# Patient Record
Sex: Female | Born: 1995 | Race: Black or African American | Hispanic: No | Marital: Married | Smoking: Never smoker
Health system: Southern US, Community
[De-identification: ages and names within clinical notes are randomized; demographics above are authoritative.]

## PROBLEM LIST (undated history)

## (undated) ENCOUNTER — Inpatient Hospital Stay (HOSPITAL_COMMUNITY): Payer: Medicaid Other

## (undated) DIAGNOSIS — Z789 Other specified health status: Secondary | ICD-10-CM

## (undated) HISTORY — DX: Other specified health status: Z78.9

## (undated) HISTORY — PX: NO PAST SURGERIES: SHX2092

---

## 2021-04-23 ENCOUNTER — Ambulatory Visit (INDEPENDENT_AMBULATORY_CARE_PROVIDER_SITE_OTHER): Payer: Self-pay

## 2021-04-23 ENCOUNTER — Other Ambulatory Visit: Payer: Self-pay

## 2021-04-23 VITALS — BP 102/81 | HR 101 | Wt 171.0 lb

## 2021-04-23 DIAGNOSIS — Z3201 Encounter for pregnancy test, result positive: Secondary | ICD-10-CM

## 2021-04-23 LAB — POCT PREGNANCY, URINE: Preg Test, Ur: POSITIVE — AB

## 2021-04-23 MED ORDER — PROMETHAZINE HCL 25 MG PO TABS: 25.0000 mg | ORAL_TABLET | Freq: Four times a day (QID) | ORAL | 1 refills | Status: DC | PRN

## 2021-04-23 NOTE — Progress Notes (Signed)
Pt dropped off urine today for UPT. UPT is positive. Pt states having nausea. Pt advised Rx Phenergan to try. Pt agreeable. Rx sent to pharmacy on file per protocol. . Pt advised to eat small meals with snacks. Pt also advised to keep hydrated with 6-8 bottles of water daily. Pt verbalized understanding.    Pt denies any vaginal bleeding, abd pain or cramps at this time. Pt advised to start taking PNV and to call OB provider for next OB appt. Pt verbalized understanding and agreeable to plan of care. Pt given number to go to [facility]. Pt verbalized understanding.   Pt scheduled for anatomy US on 11/21 @ 1:15pm.   LMP: 11/29/2020 EDC: 09/05/2021 [redacted]w[redacted]d FHR 164  Judeth Cornfield, RN

## 2021-04-23 NOTE — Patient Instructions (Addendum)
Prenatal Care Providers   [facility]  [address] [phone]  Safe Medications in Pregnancy   Acne:  Benzoyl Peroxide  Salicylic Acid   Backache/Headache:  Tylenol: 2 regular strength every 4 hours OR               2 Extra strength every 6 hours   Colds/Coughs/Allergies:  Benadryl (alcohol free) 25 mg every 6 hours as needed  Breath right strips  Claritin  Cepacol throat lozenges  Chloraseptic throat spray  Cold-Eeze- up to three times per day  Cough drops, alcohol free  Flonase (by prescription only)  Guaifenesin  Mucinex  Robitussin DM (plain only, alcohol free)  Saline nasal spray/drops  Sudafed (pseudoephedrine) & Actifed * use only after [redacted] weeks gestation and if you do not have high blood pressure  Tylenol  Vicks Vaporub  Zinc lozenges  Zyrtec   Constipation:  Colace  Ducolax suppositories  Fleet enema  Glycerin suppositories  Metamucil  Milk of magnesia  Miralax  Senokot  Smooth move tea   Diarrhea:  Kaopectate  Imodium A-D   *NO pepto Bismol   Hemorrhoids:  Anusol  Anusol HC  Preparation H  Tucks   Indigestion:  Tums  Maalox  Mylanta  Zantac  Pepcid   Insomnia:  Benadryl (alcohol free) 25mg  every 6 hours as needed  Tylenol PM  Unisom, no Gelcaps   Leg Cramps:  Tums  MagGel   Nausea/Vomiting:  Bonine  Dramamine  Emetrol  Ginger extract  Sea bands  Meclizine  Nausea medication to take during pregnancy:  Unisom (doxylamine succinate 25 mg tablets) Take one tablet daily at bedtime. If symptoms are not adequately controlled, the dose can be increased to a maximum recommended dose of two tablets daily (1/2 tablet in the morning, 1/2 tablet mid-afternoon and one at bedtime).  Vitamin B6 100mg  tablets. Take one tablet twice a day (up to 200 mg per day).   Skin Rashes:  Aveeno products  Benadryl cream or 25mg  every 6 hours as needed  Calamine Lotion  1% cortisone cream   Yeast  infection:  Gyne-lotrimin 7  Monistat 7    **If taking multiple medications, please check labels to avoid duplicating the same active ingredients  **take medication as directed on the label  ** Do not exceed 4000 mg of tylenol in 24 hours  **Do not take medications that contain aspirin or ibuprofen

## 2021-04-30 NOTE — Progress Notes (Signed)
Patient was assessed and managed by nursing staff during this encounter. I have reviewed the chart and agree with the documentation and plan. I have also made any necessary editorial changes.  [place] Bing, MD 04/30/2021 1:39 PM

## 2021-05-11 ENCOUNTER — Telehealth: Payer: Self-pay | Admitting: Obstetrics and Gynecology

## 2021-05-11 ENCOUNTER — Other Ambulatory Visit: Payer: Self-pay

## 2021-05-11 ENCOUNTER — Ambulatory Visit: Payer: Medicaid Other | Attending: Obstetrics and Gynecology

## 2021-05-11 DIAGNOSIS — Z3201 Encounter for pregnancy test, result positive: Secondary | ICD-10-CM | POA: Diagnosis present

## 2021-05-11 DIAGNOSIS — Z3A23 23 weeks gestation of pregnancy: Secondary | ICD-10-CM

## 2021-05-11 DIAGNOSIS — Z3689 Encounter for other specified antenatal screening: Secondary | ICD-10-CM | POA: Diagnosis not present

## 2021-05-11 NOTE — Telephone Encounter (Signed)
Attempted to reach patient about scheduling an appointment. Was not able to speak with patient. Left a voicemail message for her to call the office to schedule her New OB appointment.

## 2021-05-13 ENCOUNTER — Ambulatory Visit (INDEPENDENT_AMBULATORY_CARE_PROVIDER_SITE_OTHER): Payer: Medicaid Other

## 2021-05-13 ENCOUNTER — Other Ambulatory Visit: Payer: Self-pay

## 2021-05-13 VITALS — BP 104/75 | HR 109 | Temp 98.0°F | Wt 171.4 lb

## 2021-05-13 DIAGNOSIS — O0990 Supervision of high risk pregnancy, unspecified, unspecified trimester: Secondary | ICD-10-CM | POA: Insufficient documentation

## 2021-05-13 DIAGNOSIS — O0930 Supervision of pregnancy with insufficient antenatal care, unspecified trimester: Secondary | ICD-10-CM | POA: Insufficient documentation

## 2021-05-13 DIAGNOSIS — Z3A23 23 weeks gestation of pregnancy: Secondary | ICD-10-CM

## 2021-05-13 DIAGNOSIS — Z3480 Encounter for supervision of other normal pregnancy, unspecified trimester: Secondary | ICD-10-CM

## 2021-05-13 DIAGNOSIS — Z3400 Encounter for supervision of normal first pregnancy, unspecified trimester: Secondary | ICD-10-CM | POA: Insufficient documentation

## 2021-05-13 NOTE — Progress Notes (Signed)
    I discussed the limitations, risks, security and privacy concerns of performing an evaluation and management service by telephone and the availability of in person appointments. I also discussed with the patient that there may be a patient responsible charge related to this service. The patient expressed understanding and agreed to proceed.   Location: [facility] Patient: clinic Provider: clinic Interpreter used: a telephonic interpreter Salome [phone]  PRENATAL INTAKE SUMMARY  Ms. Bieker presents today New OB Nurse Interview.  OB History     Gravida  1   Para  0   Term  0   Preterm  0   AB  0   Living  0      SAB  0   IAB  0   Ectopic  0   Multiple  0   Live Births  0          I have reviewed the patient's medical, obstetrical, social, and family histories, medications, and available lab results.  SUBJECTIVE She has no unusual complaints  OBJECTIVE Initial Nurse interview for history/labs (New OB)  last menstrual period date: 11/29/2020 EDD: 09/05/2021  GA: [redacted]w[redacted]d G1P0000 FHT: 154  GENERAL APPEARANCE: alert, well appearing, in no apparent distress, oriented to person, place and time   ASSESSMENT Normal pregnancy late to prenatal care   PLAN Prenatal care:  [facility] Pnl/HIV/Hep C OB Urine Culture GC/CT- will obtain at next visit  PAPpatient has never had a pap test will obtain at next visit  HgbEval/SMA/CF ([facility]) Panorama A1C   Anatomy ultrasound 05/12/2021  Placed OB Box on problem list and updated   Followup with Raelyn Mora CNM 05/28/2021 at 2:35   Follow Up Instructions:   I discussed the assessment and treatment plan with the patient. The patient was provided an opportunity to ask questions and all were answered. The patient agreed with the plan and demonstrated an understanding of the instructions.   The patient was advised to call back or seek an in-person evaluation if the symptoms worsen or if the  condition fails to improve as anticipated.  I provided 40 minutes of  face-to-face time during this encounter.  Gearldine Shown, CMA

## 2021-05-13 NOTE — Patient Instructions (Signed)
AREA PEDIATRIC/FAMILY PRACTICE PHYSICIANS  [facility] [address] [place], [place] [address] Phone - [phone]   Fax - [phone]  [facility] [address] [place], [place]  [address] Phone - [phone]   Fax - [phone]  [facility] [address] [place], [place]  [address] Phone - [phone]   Fax - [phone]  [facility] [address] [place], [place]  [address] Phone - [phone]   Fax - [phone]  [facility] [address] [place], [place]  [address] Phone - [phone]   Fax - [phone]  [facility] [address] [place], [place]  [address] Phone - [phone]   Fax - [phone]  [facility] [address] [place], [place]  [address] Phone - [phone]   Fax - [phone]  [facility] AT [facility] [address] [place], [address] [place], [place]  [address] Phone - [phone]   Fax - [phone]  [facility] AT [facility] [address] [place], [place]  [address] Phone - [phone]   Fax - [phone] [facility] [place] [address] [place], [place]  [address] Phone - [phone]   Fax - [phone]  [facility] [address] [place], [place]  [address] Phone - [phone]   Fax - [phone]  [facility] [address] [place], [place]  [address] Phone - [phone]   Fax - [phone]  [facility] [address] [place], [place]  [address] Phone - [phone]   Fax - [phone]  [facility] [address] [place], [place]  [address] Phone - [phone]  [facility] [address] [place], [place]  [address] Phone - [phone]   Fax - [phone]  [facility] [address] [place], [place]  [address] Phone - [phone]   Fax - [phone]  [facility] [address] [place], [place]  [address] Phone - [phone]   Fax - [phone]  [facility] [address] [place], [place]  [address] Phone - [phone]   Fax - [phone]   [facility] [address] [place], [place]  [address] Phone - [phone]   Fax - [phone]  [facility] [address] [place], [place]  [address] Phone - [phone]   Fax - [phone]  DA[facility] RUBIN [address] [place], [place]  [address] Phone - [phone]   Fax - [phone]  [facility] [address] [place], [place]  [address] Phone - [phone]   Fax - [phone]  [facility] [address] [place], [place]  [address] Phone - [phone]   Fax - [phone] Gerarda Fraction [address] [place], [place]  [address] Phone - [phone]   Fax - [phone]  [facility] [address] [place], [place]  [address] Phone - [phone]   Fax - [phone]  [facility] - [place] [address] [place], [place]  [address] Phone - [phone]   Fax - [phone]   Please remember that if you are not able to keep your appointment to call the office and reschedule, or cancel. If you no-show or cancel with less than 24 hour notice we  will charge you, not your insurance a $50 fee.

## 2021-05-14 ENCOUNTER — Encounter: Payer: Self-pay | Admitting: Obstetrics and Gynecology

## 2021-05-14 DIAGNOSIS — O99019 Anemia complicating pregnancy, unspecified trimester: Secondary | ICD-10-CM | POA: Insufficient documentation

## 2021-05-14 LAB — OBSTETRIC PANEL, INCLUDING HIV
Antibody Screen: NEGATIVE
Basophils Absolute: 0 10*3/uL (ref 0.0–0.2)
Basos: 0 %
EOS (ABSOLUTE): 0.1 10*3/uL (ref 0.0–0.4)
Eos: 2 %
HIV Screen 4th Generation wRfx: NONREACTIVE
Hematocrit: 29.3 % — ABNORMAL LOW (ref 34.0–46.6)
Hemoglobin: 8.8 g/dL — ABNORMAL LOW (ref 11.1–15.9)
Hepatitis B Surface Ag: NEGATIVE
Immature Grans (Abs): 0.1 10*3/uL (ref 0.0–0.1)
Immature Granulocytes: 1 %
Lymphocytes Absolute: 2 10*3/uL (ref 0.7–3.1)
Lymphs: 34 %
MCH: 21.1 pg — ABNORMAL LOW (ref 26.6–33.0)
MCHC: 30 g/dL — ABNORMAL LOW (ref 31.5–35.7)
MCV: 70 fL — ABNORMAL LOW (ref 79–97)
Monocytes Absolute: 0.4 10*3/uL (ref 0.1–0.9)
Monocytes: 6 %
Neutrophils Absolute: 3.3 10*3/uL (ref 1.4–7.0)
Neutrophils: 57 %
Platelets: 296 10*3/uL (ref 150–450)
RBC: 4.18 x10E6/uL (ref 3.77–5.28)
RDW: 18.4 % — ABNORMAL HIGH (ref 11.7–15.4)
RPR Ser Ql: NONREACTIVE
Rh Factor: POSITIVE
Rubella Antibodies, IGG: 6.05 index (ref >0.99)
WBC: 5.9 10*3/uL (ref 3.4–10.8)

## 2021-05-14 LAB — AFP, SERUM, OPEN SPINA BIFIDA
AFP MoM: 1.47
AFP Value: 130.8 ng/mL
Gest. Age on Collection Date: 23 weeks
Maternal Age At EDD: 25.2 yr
OSBR Risk 1 IN: 5931
Test Results:: NEGATIVE
Weight: 171 [lb_av]

## 2021-05-14 LAB — HEMOGLOBIN A1C
Est. average glucose Bld gHb Est-mCnc: 114 mg/dL
Hgb A1c MFr Bld: 5.6 % (ref 4.8–5.6)

## 2021-05-14 LAB — HEPATITIS C ANTIBODY: Hep C Virus Ab: <0.1 s/co ratio (ref 0.0–0.9)

## 2021-05-18 ENCOUNTER — Other Ambulatory Visit: Payer: Self-pay | Admitting: *Deleted

## 2021-05-18 DIAGNOSIS — O99019 Anemia complicating pregnancy, unspecified trimester: Secondary | ICD-10-CM

## 2021-05-18 LAB — CULTURE, OB URINE

## 2021-05-18 LAB — URINE CULTURE, OB REFLEX

## 2021-05-18 MED ORDER — FERROUS SULFATE 325 (65 FE) MG PO TABS: 325.0000 mg | ORAL_TABLET | ORAL | 3 refills | Status: DC

## 2021-05-18 MED ORDER — ASCORBIC ACID 500 MG PO TABS
500.0000 mg | ORAL_TABLET | ORAL | 3 refills | Status: DC
Start: 2021-05-18 — End: 2021-08-19

## 2021-05-19 ENCOUNTER — Other Ambulatory Visit: Payer: Self-pay | Admitting: *Deleted

## 2021-05-19 NOTE — Addendum Note (Signed)
Addended by: Clovis Pu on: 05/19/2021 08:23 AM   Modules accepted: Orders

## 2021-05-27 ENCOUNTER — Other Ambulatory Visit: Payer: Self-pay

## 2021-05-27 ENCOUNTER — Ambulatory Visit (HOSPITAL_COMMUNITY)
Admission: RE | Admit: 2021-05-27 | Discharge: 2021-05-27 | Disposition: A | Discharge status: Home / Self Care | Payer: Medicaid Other | Source: Ambulatory Visit | Attending: Family Medicine | Admitting: Family Medicine

## 2021-05-27 DIAGNOSIS — Z3A00 Weeks of gestation of pregnancy not specified: Secondary | ICD-10-CM | POA: Diagnosis not present

## 2021-05-27 DIAGNOSIS — D509 Iron deficiency anemia, unspecified: Secondary | ICD-10-CM | POA: Insufficient documentation

## 2021-05-27 DIAGNOSIS — O99019 Anemia complicating pregnancy, unspecified trimester: Secondary | ICD-10-CM | POA: Diagnosis not present

## 2021-05-27 MED ORDER — SODIUM CHLORIDE 0.9 % IV SOLN
300.0000 mg | INTRAVENOUS | Status: DC
  Administered 2021-05-27: 300 mg via INTRAVENOUS
  Filled 2021-05-27: qty 300

## 2021-05-28 ENCOUNTER — Other Ambulatory Visit (HOSPITAL_COMMUNITY)
Admission: RE | Admit: 2021-05-28 | Discharge: 2021-05-28 | Disposition: A | Discharge status: Home / Self Care | Payer: Medicaid Other | Source: Ambulatory Visit | Attending: Obstetrics and Gynecology | Admitting: Obstetrics and Gynecology

## 2021-05-28 ENCOUNTER — Ambulatory Visit (INDEPENDENT_AMBULATORY_CARE_PROVIDER_SITE_OTHER): Payer: Medicaid Other | Admitting: Obstetrics and Gynecology

## 2021-05-28 ENCOUNTER — Encounter: Payer: Self-pay | Admitting: Obstetrics and Gynecology

## 2021-05-28 ENCOUNTER — Other Ambulatory Visit: Payer: Self-pay

## 2021-05-28 VITALS — BP 112/77 | HR 119 | Temp 98.4°F | Wt 174.0 lb

## 2021-05-28 DIAGNOSIS — O26842 Uterine size-date discrepancy, second trimester: Secondary | ICD-10-CM

## 2021-05-28 DIAGNOSIS — Z603 Acculturation difficulty: Secondary | ICD-10-CM

## 2021-05-28 DIAGNOSIS — O99019 Anemia complicating pregnancy, unspecified trimester: Secondary | ICD-10-CM

## 2021-05-28 DIAGNOSIS — Z124 Encounter for screening for malignant neoplasm of cervix: Secondary | ICD-10-CM | POA: Insufficient documentation

## 2021-05-28 DIAGNOSIS — Z3400 Encounter for supervision of normal first pregnancy, unspecified trimester: Secondary | ICD-10-CM

## 2021-05-28 DIAGNOSIS — O285 Abnormal chromosomal and genetic finding on antenatal screening of mother: Secondary | ICD-10-CM

## 2021-05-28 DIAGNOSIS — Z789 Other specified health status: Secondary | ICD-10-CM

## 2021-05-28 DIAGNOSIS — Z3A25 25 weeks gestation of pregnancy: Secondary | ICD-10-CM

## 2021-05-28 HISTORY — DX: Abnormal chromosomal and genetic finding on antenatal screening of mother: O28.5

## 2021-05-28 HISTORY — DX: Uterine size-date discrepancy, second trimester: O26.842

## 2021-05-28 NOTE — Progress Notes (Signed)
INITIAL OBSTETRICAL VISIT Patient name: Victoria Conner MRN [id number]  Date of birth: 1996-06-17 Chief Complaint:   Initial Prenatal Visit  History of Present Illness:   Victoria Conner is a 25 y.o. G46P0000 African female at [redacted]w[redacted]d by LMP and U/S with an Estimated Date of Delivery: 09/05/21 being seen today for her initial obstetrical visit.  Her obstetrical history is significant for  none . This is a planned pregnancy. She and the father of the baby (FOB) "Jumanne" live together. She has a support system that consists of FOB/family/friends. Today she reports no complaints.   Patient's last menstrual period was 11/29/2020 (exact date). Last pap unknown. Review of Systems:   Pertinent items are noted in HPI Denies cramping/contractions, leakage of fluid, vaginal bleeding, abnormal vaginal discharge w/ itching/odor/irritation, headaches, visual changes, shortness of breath, chest pain, abdominal pain, severe nausea/vomiting, or problems with urination or bowel movements unless otherwise stated above.  Pertinent History Reviewed:  Reviewed past medical,surgical, social, obstetrical and family history.  Reviewed problem list, medications and allergies. OB History  Gravida Para Term Preterm AB Living  1 0 0 0 0 0  SAB IAB Ectopic Multiple Live Births  0 0 0 0 0    # Outcome Date GA Lbr Len/2nd Weight Sex Delivery Anes PTL Lv  1 Current            Physical Assessment:   Vitals:   05/28/21 1444  BP: 112/77  Pulse: (!) 119  Temp: 98.4 F (36.9 C)  Weight: 174 lb (78.9 kg)  There is no height or weight on file to calculate BMI.       Physical Examination:  General appearance - well appearing, and in no distress  Mental status - alert, oriented to person, place, and time  Psych:  She has a normal mood and affect  Skin - warm and dry, normal color, no suspicious lesions noted  Chest - effort normal, all lung fields clear to auscultation bilaterally  Heart - normal rate and regular  rhythm  Abdomen - soft, nontender  Extremities:  No swelling or varicosities noted  Pelvic - VULVA: normal appearing vulva with no masses, tenderness or lesions  VAGINA: normal appearing vagina with thick yellowish-green discharge, no lesions.   CERVIX: unable to visualize with speculum after several unsuccessful attempts Thin prep pap is done with reflex HR HPV cotesting>>obtained using blind swab technique   FHTs by doppler: 155 bpm  Assessment & Plan:  1) Low-Risk Pregnancy G1P0000 at [redacted]w[redacted]d with an Estimated Date of Delivery: 09/05/21   2) Initial OB visit - Welcomed to practice and introduced self to patient in addition to discussing other advanced practice providers that she may be seeing at this practice - Congratulated patient - Anticipatory guidance on upcoming appointments - Educated on COVID19 and pregnancy and the integration of virtual appointments  - Educated on babyscripts app- patient reports she has not received email, encouraged to look in spam folder and to call office if she still has not received email - patient verbalizes understanding    3) Supervision of normal first pregnancy, antepartum - Anticipatory guidance for 2 hr GTT - advised to fast after midnight without anything to eat or drink (except for water), will have fasting blood drawn, drink the glucola drink (flavor choices: orange, fruit punch or lemon-lime), have a visit with a provider during the first hour of testing, wait in the lab waiting room to have blood drawn at 1 hour and then 2 hours after  finishing glucola drink.   4)  Abnormal chromosomal and genetic finding on antenatal screening mother - AMB MFM GENETICS REFERRAL  5) Cervical cancer screening - Cytology - PAP( [place]) - Cervicovaginal ancillary only( [place])   6) Language barrier affecting health care - [employer] Video Spanish Interpreter, [place] #[id number] used for entire visit   7) [redacted] weeks gestation of pregnancy  8)  Uterine size date discrepancy pregnancy, second trimester - Will need growth U/S at 36 weeks  9) Anemia during pregnancy  - Received Venofer infusion on 05/27/2021 - Taking FeSO4 and vitamin C QOD  Meds: No orders of the defined types were placed in this encounter.   Initial labs obtained Continue prenatal vitamins Reviewed n/v relief measures and warning s/s to report Reviewed recommended weight gain based on pre-gravid BMI Encouraged well-balanced diet Genetic Screening discussed: results reviewed Cystic fibrosis, SMA, Fragile X screening discussed results reviewed The nature of [facility] with multiple MDs and other Advanced Practice Providers was explained to patient; also emphasized that residents, students are part of our team.  Discussed optimized OB schedule. Advised can have an in-office visit whenever she feels she needs to be seen.  Does not have own BP cuff. Will require in-person visits only. Advised to call during normal business hours and there is an after-hours nurse line available.    Follow-up: Return in about 4 weeks (around 06/25/2021) for Return OB 2hr GTT.   Orders Placed This Encounter  Procedures   AMB MFM GENETICS REFERRAL    Raelyn Mora MSN, CNM 05/28/2021

## 2021-05-28 NOTE — Patient Instructions (Signed)
Oral Glucose Tolerance Test During Pregnancy °Why am I having this test? °The oral glucose tolerance test (OGTT) is done to check how your body processes blood sugar (glucose). This is one of several tests used to diagnose diabetes that develops during pregnancy (gestational diabetes mellitus). Gestational diabetes is a short-term form of diabetes that some women develop while they are pregnant. It usually occurs during the second trimester of pregnancy and goes away after delivery. °Testing, or screening, for gestational diabetes usually occurs at weeks 24-28 of pregnancy. You may have the OGTT test after having a 1-hour glucose screening test if the results from that test indicate that you may have gestational diabetes. This test may also be needed if: °You have a history of gestational diabetes. °There is a history of giving birth to very large babies or of losing pregnancies (having stillbirths). °You have signs and symptoms of diabetes, such as: °Changes in your eyesight. °Tingling or numbness in your hands or feet. °Changes in hunger, thirst, and urination, and these are not explained by your pregnancy. °What is being tested? °This test measures the amount of glucose in your blood at different times during a period of 3 hours. This shows how well your body can process glucose. °What kind of sample is taken? °Blood samples are required for this test. They are usually collected by inserting a needle into a blood vessel. °How do I prepare for this test? °For 3 days before your test, eat normally. Have plenty of carbohydrate-rich foods. °Follow instructions from your health care provider about: °Eating or drinking restrictions on the day of the test. You may be asked not to eat or drink anything other than water (to fast) starting 8-10 hours before the test. °Changing or stopping your regular medicines. Some medicines may interfere with this test. °Tell a health care provider about: °All medicines you are taking,  including vitamins, herbs, eye drops, creams, and over-the-counter medicines. °Any blood disorders you have. °Any surgeries you have had. °Any medical conditions you have. °What happens during the test? °First, your blood glucose will be measured. This is referred to as your fasting blood glucose because you fasted before the test. Then, you will drink a glucose solution that contains a certain amount of glucose. Your blood glucose will be measured again 1, 2, and 3 hours after you drink the solution. °This test takes about 3 hours to complete. You will need to stay at the testing location during this time. During the testing period: °Do not eat or drink anything other than the glucose solution. °Do not exercise. °Do not use any products that contain nicotine or tobacco, such as cigarettes, e-cigarettes, and chewing tobacco. These can affect your test results. If you need help quitting, ask your health care provider. °The testing procedure may vary among health care providers and hospitals. °How are the results reported? °Your results will be reported as milligrams of glucose per deciliter of blood (mg/dL) or millimoles per liter (mmol/L). There is more than one source for screening and diagnosis reference values used to diagnose gestational diabetes. Your health care provider will compare your results to normal values that were established after testing a large group of people (reference values). Reference values may vary among labs and hospitals. For this test ([facility]arpenter-Coustan), reference values are: °Fasting: 95 mg/dL (5.3 mmol/L). °1 hour: 180 mg/dL (10.0 mmol/L). °2 hour: 155 mg/dL (8.6 mmol/L). °3 hour: 140 mg/dL (7.8 mmol/L). °What do the results mean? °Results below the reference values are considered   normal. If two or more of your blood glucose levels are at or above the reference values, you may be diagnosed with gestational diabetes. If only one level is high, your health care provider may suggest  repeat testing or other tests to confirm a diagnosis. °Talk with your health care provider about what your results mean. °Questions to ask your health care provider °Ask your health care provider, or the department that is doing the test: °When will my results be ready? °How will I get my results? °What are my treatment options? °What other tests do I need? °What are my next steps? °Summary °The oral glucose tolerance test (OGTT) is one of several tests used to diagnose diabetes that develops during pregnancy (gestational diabetes mellitus). Gestational diabetes is a short-term form of diabetes that some women develop while they are pregnant. °You may have the OGTT test after having a 1-hour glucose screening test if the results from that test show that you may have gestational diabetes. You may also have this test if you have any symptoms or risk factors for this type of diabetes. °Talk with your health care provider about what your results mean. °This information is not intended to replace advice given to you by your health care provider. Make sure you discuss any questions you have with your health care provider. °Document Revised: 11/15/2019 Document Reviewed: 11/15/2019 °Elsevier Patient Education © 2022 [employer]. ° °

## 2021-05-29 LAB — CERVICOVAGINAL ANCILLARY ONLY
Bacterial Vaginitis (gardnerella): POSITIVE — AB
Candida Glabrata: NEGATIVE
Candida Vaginitis: POSITIVE — AB
Chlamydia: NEGATIVE
Comment: NEGATIVE
Comment: NEGATIVE
Comment: NEGATIVE
Comment: NEGATIVE
Comment: NEGATIVE
Comment: NORMAL
Neisseria Gonorrhea: NEGATIVE
Trichomonas: NEGATIVE

## 2021-06-01 LAB — CYTOLOGY - PAP
Adequacy: ABSENT
Comment: NEGATIVE
Diagnosis: NEGATIVE
High risk HPV: NEGATIVE

## 2021-06-03 ENCOUNTER — Encounter (HOSPITAL_COMMUNITY)
Admission: RE | Admit: 2021-06-03 | Discharge: 2021-06-03 | Disposition: A | Discharge status: Home / Self Care | Payer: Medicaid Other | Source: Ambulatory Visit | Attending: Family Medicine | Admitting: Family Medicine

## 2021-06-03 DIAGNOSIS — O99012 Anemia complicating pregnancy, second trimester: Secondary | ICD-10-CM | POA: Diagnosis not present

## 2021-06-03 DIAGNOSIS — D509 Iron deficiency anemia, unspecified: Secondary | ICD-10-CM | POA: Insufficient documentation

## 2021-06-03 DIAGNOSIS — Z3A25 25 weeks gestation of pregnancy: Secondary | ICD-10-CM | POA: Diagnosis not present

## 2021-06-03 MED ORDER — SODIUM CHLORIDE 0.9 % IV SOLN
300.0000 mg | INTRAVENOUS | Status: DC
  Administered 2021-06-03: 09:00:00 300 mg via INTRAVENOUS
  Filled 2021-06-03: qty 300

## 2021-06-04 ENCOUNTER — Ambulatory Visit: Payer: Medicaid Other | Attending: Obstetrics and Gynecology

## 2021-06-04 ENCOUNTER — Other Ambulatory Visit: Payer: Self-pay

## 2021-06-04 DIAGNOSIS — O285 Abnormal chromosomal and genetic finding on antenatal screening of mother: Secondary | ICD-10-CM

## 2021-06-04 NOTE — Progress Notes (Signed)
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   EDC: 09/05/2021 LMP: 11/29/2020 Referring Provider:  Raelyn Conner, CNM  EGA: [redacted]w[redacted]d Genetic Counselor: Teena Dunk, MS, CGC  OB Hx: G1P0 Date of Appointment: 06/04/2021  Accompanied by: Swahili interpreter Face to Face Time: 30 Minutes   Previous Testing Completed: Victoria Conner previously completed Non-Invasive Prenatal Screening (NIPS) in this pregnancy (scanned into Epic under the Media tab). The result is low risk, consistent with a female fetus. This screening significantly reduces the risk that the current pregnancy has Down syndrome, Trisomy 69, Trisomy 13, Monosomy X, and Triploidy, however, the risk is not zero given the limitations of NIPS. Additionally, there are many genetic conditions that cannot be detected by NIPS.  Victoria Conner previously completed carrier screening (scanned into Epic under the Media tab). 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 She screened to not be a carrier for Cystic Fibrosis (CF), alpha thalassemia, and beta hemoglobinopathies. A negative result on carrier screening reduces the likelihood of being a carrier, however, does not entirely rule out the possibility. Victoria Conner previously completed a maternal serum AFP screen in this pregnancy. The result is screen negative. A negative result reduces the risk that the current pregnancy has an open neural tube defect, however, does not entirely rule out the possibility.    Medical & Family History:  Denies personal history of diabetes, high blood pressure, thyroid conditions, and seizures. Denies bleeding, infections, and fevers in this pregnancy. Denies using tobacco, alcohol, or street drugs in this pregnancy. Maternal ethnicity reported as Black and paternal ethnicity reported as Black. Denies Ashkenazi Jewish  ancestry. Denies a family history of chromosome conditions, intellectual disability, Autism, birth defects, skeletal disorders, blindness, deafness, blood disorders, nerve or muscle disorders, stillbirths, early infant deaths, and multiple miscarriages.      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 SMA is an autosomal recessive disease caused by loss of function mutations in the SMN1 gene. Victoria Conner screened to have two functional copies of the SMN1 gene, however, due to limitations of genetic screening the laboratory cannot confirm if Victoria Conner's two copies are on the same chromosome or on opposite chromosomes. The laboratory identified the variant [id number] in Victoria Conner's screening, meaning there is increased risk for Victoria Conner's two copies of the SMN1 gene to be on the same chromosome. If Victoria Conner's two copies are on the same chromosome that means her other chromosome would have zero copies, and there could be risk for an affected pregnancy if her reproductive partner is found to be a carrier for SMA. Individuals with SMA have progressive, proximal, and symmetrical muscle weakness and atrophy due to degeneration of motor neurons of the spine and brainstem. Symptoms can first appear at any time between before birth to adulthood. There are five types of SMA that are historically classified based on clinical presentation, onset of symptoms, and the maximum skill level of motor milestones achieved, however, there is a lot of overlap between types. There are several treatments available for individuals affected with SMA and studies show that treatment is most effective when it is started in the first few months of life. Given Victoria Conner's carrier screening result, genetic counseling recommended screening her reproductive partner for SMA. Victoria Conner appeared to understand the information discussed and stated that she will talk to her  reproductive partner about completing  carrier screening for SMA. Victoria Conner reported to genetic counseling that if her partner decides to complete carrier screening for SMA they will contact the [facility]. Genetic counseling reviewed with Victoria Conner that SMA is included in Victoria Conner's newborn screening program.    Patient Plan:  Proceed with: Routine prenatal care Informed consent was obtained. All questions were answered.  Declined: Carrier screening for Victoria Conner's reproductive partner for SMA   Thank you for sharing in the care of Victoria Conner with [place].  Please do not hesitate to contact us if you have any questions.  Teena Dunk, MS, [facility]

## 2021-06-10 ENCOUNTER — Encounter (HOSPITAL_COMMUNITY)
Admission: RE | Admit: 2021-06-10 | Discharge: 2021-06-10 | Disposition: A | Discharge status: Home / Self Care | Payer: Medicaid Other | Source: Ambulatory Visit | Attending: Family Medicine | Admitting: Family Medicine

## 2021-06-10 DIAGNOSIS — O99012 Anemia complicating pregnancy, second trimester: Secondary | ICD-10-CM | POA: Diagnosis not present

## 2021-06-10 MED ORDER — SODIUM CHLORIDE 0.9 % IV SOLN
300.0000 mg | INTRAVENOUS | Status: DC
  Administered 2021-06-10: 10:00:00 300 mg via INTRAVENOUS
  Filled 2021-06-10: qty 15

## 2021-06-18 ENCOUNTER — Other Ambulatory Visit: Payer: Self-pay | Admitting: Obstetrics and Gynecology

## 2021-06-18 DIAGNOSIS — O2343 Unspecified infection of urinary tract in pregnancy, third trimester: Secondary | ICD-10-CM

## 2021-06-18 MED ORDER — CEFADROXIL 500 MG PO CAPS
500.0000 mg | ORAL_CAPSULE | Freq: Two times a day (BID) | ORAL | 0 refills | Status: AC
Start: 2021-06-18 — End: 2021-06-28

## 2021-06-25 ENCOUNTER — Other Ambulatory Visit: Payer: Self-pay

## 2021-06-25 ENCOUNTER — Encounter: Payer: Self-pay | Admitting: Obstetrics and Gynecology

## 2021-06-25 ENCOUNTER — Ambulatory Visit (INDEPENDENT_AMBULATORY_CARE_PROVIDER_SITE_OTHER): Payer: Medicaid Other | Admitting: Obstetrics and Gynecology

## 2021-06-25 VITALS — BP 126/82 | HR 113 | Temp 98.3°F | Wt 170.8 lb

## 2021-06-25 DIAGNOSIS — O2343 Unspecified infection of urinary tract in pregnancy, third trimester: Secondary | ICD-10-CM

## 2021-06-25 DIAGNOSIS — O26843 Uterine size-date discrepancy, third trimester: Secondary | ICD-10-CM

## 2021-06-25 DIAGNOSIS — Z3400 Encounter for supervision of normal first pregnancy, unspecified trimester: Secondary | ICD-10-CM

## 2021-06-25 DIAGNOSIS — Z789 Other specified health status: Secondary | ICD-10-CM

## 2021-06-25 DIAGNOSIS — Z3A29 29 weeks gestation of pregnancy: Secondary | ICD-10-CM

## 2021-06-25 NOTE — Progress Notes (Signed)
° °  LOW-RISK PREGNANCY OFFICE VISIT Patient name: Victoria Conner MRN [id number]  Date of birth: [birth year] Chief Complaint:   Routine Prenatal Visit  History of Present Illness:   Victoria Conner is a 26 y.o. G5P0000 female at [redacted]w[redacted]d with an Estimated Date of Delivery: 09/05/21 being seen today for ongoing management of a low-risk pregnancy.  Today she reports no complaints. Contractions: Not present. Vag. Bleeding: None.  Movement: Present. denies leaking of fluid. Review of Systems:   Pertinent items are noted in HPI Denies abnormal vaginal discharge w/ itching/odor/irritation, headaches, visual changes, shortness of breath, chest pain, abdominal pain, severe nausea/vomiting, or problems with urination or bowel movements unless otherwise stated above. Pertinent History Reviewed:  Reviewed past medical,surgical, social, obstetrical and family history.  Reviewed problem list, medications and allergies. Physical Assessment:   Vitals:   06/25/21 0828  BP: 126/82  Pulse: (!) 113  Temp: 98.3 F (36.8 C)  Weight: 170 lb 12.8 oz (77.5 kg)  There is no height or weight on file to calculate BMI.        Physical Examination:   General appearance: Well appearing, and in no distress  Mental status: Alert, oriented to person, place, and time  Skin: Warm & dry  Cardiovascular: Normal heart rate noted  Respiratory: Normal respiratory effort, no distress  Abdomen: Soft, gravid, nontender  Pelvic: Cervical exam deferred         Extremities: Edema: None  Fetal Status: Fetal Heart Rate (bpm): 150 Fundal Height: 35 cm Movement: Present Presentation: Undeterminable  No results found for this or any previous visit (from the past 24 hour(s)).  Assessment & Plan:  1) Low-risk pregnancy G1P0000 at [redacted]w[redacted]d with an Estimated Date of Delivery: 09/05/21   2) Supervision of normal first pregnancy, antepartum  - Glucose Tolerance, 2 Hours w/1 Hour,  - HIV Antibody (routine testing w rflx),  - RPR,  -  CBC  3) [redacted] weeks gestation of pregnancy  - Declined  4) Language barrier affecting health care - [employer] Video Swahili Interpreter, Delon [place] [phone] used for first portion of visit  - [employer] Audio Swahili Interpreter, Antony Contras [phone] used for discharge instructions  5) Uterine size date discrepancy pregnancy, third trimester  - [place] MFM OB FOLLOW UP >> Scheduled for 07/14/2021 @ 0830  6) Urinary tract infection in mother during third trimester of pregnancy - Unaware of dx - has not taken - Patient and husband will go pick up now when she leaves the office.   Meds: No orders of the defined types were placed in this encounter.  Labs/procedures today: 2 hr GTT and 3rd trimester labs  Plan:  Continue routine obstetrical care   Reviewed: Preterm labor symptoms and general obstetric precautions including but not limited to vaginal bleeding, contractions, leaking of fluid and fetal movement were reviewed in detail with the patient.  All questions were answered.   Follow-up: Return in about 4 weeks (around 07/23/2021) for Return OB visit.  Orders Placed This Encounter  Procedures   [place] MFM OB FOLLOW UP   Glucose Tolerance, 2 Hours w/1 Hour   HIV Antibody (routine testing w rflx)   RPR   CBC   Raelyn Mora MSN, CNM 06/25/2021 9:03 AM

## 2021-06-26 LAB — GLUCOSE TOLERANCE, 2 HOURS W/ 1HR
Glucose, 1 hour: 182 mg/dL — ABNORMAL HIGH (ref 70–179)
Glucose, 2 hour: 150 mg/dL (ref 70–152)
Glucose, Fasting: 102 mg/dL — ABNORMAL HIGH (ref 70–91)

## 2021-06-26 LAB — CBC
Hematocrit: 35.9 % (ref 34.0–46.6)
Hemoglobin: 11.2 g/dL (ref 11.1–15.9)
MCH: 25.3 pg — ABNORMAL LOW (ref 26.6–33.0)
MCHC: 31.2 g/dL — ABNORMAL LOW (ref 31.5–35.7)
MCV: 81 fL (ref 79–97)
Platelets: 239 10*3/uL (ref 150–450)
RBC: 4.43 x10E6/uL (ref 3.77–5.28)
WBC: 6.1 10*3/uL (ref 3.4–10.8)

## 2021-06-26 LAB — HIV ANTIBODY (ROUTINE TESTING W REFLEX): HIV Screen 4th Generation wRfx: NONREACTIVE

## 2021-06-26 LAB — RPR: RPR Ser Ql: NONREACTIVE

## 2021-06-28 ENCOUNTER — Encounter: Payer: Self-pay | Admitting: Obstetrics and Gynecology

## 2021-06-28 ENCOUNTER — Other Ambulatory Visit: Payer: Self-pay | Admitting: Obstetrics and Gynecology

## 2021-06-28 DIAGNOSIS — O24410 Gestational diabetes mellitus in pregnancy, diet controlled: Secondary | ICD-10-CM

## 2021-06-28 DIAGNOSIS — O24419 Gestational diabetes mellitus in pregnancy, unspecified control: Secondary | ICD-10-CM | POA: Insufficient documentation

## 2021-06-28 MED ORDER — ACCU-CHEK GUIDE W/DEVICE KIT: 1.0000 | PACK | Freq: Four times a day (QID) | 0 refills | Status: DC

## 2021-06-28 MED ORDER — ACCU-CHEK SOFTCLIX LANCETS MISC: 12 refills | Status: DC

## 2021-07-01 ENCOUNTER — Telehealth: Payer: Self-pay | Admitting: Certified Nurse Midwife

## 2021-07-01 NOTE — Telephone Encounter (Signed)
Spoke with her husband about her appointment scheduled on 01/26. He said he knew about the appointment.

## 2021-07-02 NOTE — Progress Notes (Signed)
Patient with iron deficiency - needs IV iron. Hgb is 8.8 °

## 2021-07-02 NOTE — Addendum Note (Signed)
Encounter addended by: Reva Bores, MD on: 07/02/2021 1:16 PM  Actions taken: Clinical Note Signed

## 2021-07-02 NOTE — Progress Notes (Signed)
Patient with iron deficiency - needs IV iron. Hgb is 8.8

## 2021-07-02 NOTE — Addendum Note (Signed)
Encounter addended by: Donnamae Jude, MD on: 07/02/2021 1:17 PM  Actions taken: Clinical Note Signed

## 2021-07-03 ENCOUNTER — Other Ambulatory Visit: Payer: Self-pay | Admitting: Obstetrics and Gynecology

## 2021-07-03 NOTE — Progress Notes (Signed)
error 

## 2021-07-10 ENCOUNTER — Ambulatory Visit: Payer: Medicaid Other

## 2021-07-14 ENCOUNTER — Other Ambulatory Visit: Payer: Self-pay | Admitting: *Deleted

## 2021-07-14 ENCOUNTER — Other Ambulatory Visit: Payer: Self-pay

## 2021-07-14 ENCOUNTER — Ambulatory Visit: Payer: Medicaid Other | Attending: Obstetrics and Gynecology

## 2021-07-14 ENCOUNTER — Ambulatory Visit: Payer: Medicaid Other | Attending: Obstetrics and Gynecology | Admitting: Obstetrics and Gynecology

## 2021-07-14 ENCOUNTER — Ambulatory Visit: Payer: Medicaid Other | Admitting: *Deleted

## 2021-07-14 ENCOUNTER — Other Ambulatory Visit: Payer: Self-pay | Admitting: Obstetrics and Gynecology

## 2021-07-14 ENCOUNTER — Encounter: Payer: Self-pay | Admitting: *Deleted

## 2021-07-14 VITALS — BP 116/78 | HR 89

## 2021-07-14 DIAGNOSIS — O26843 Uterine size-date discrepancy, third trimester: Secondary | ICD-10-CM | POA: Diagnosis not present

## 2021-07-14 DIAGNOSIS — O0933 Supervision of pregnancy with insufficient antenatal care, third trimester: Secondary | ICD-10-CM | POA: Insufficient documentation

## 2021-07-14 DIAGNOSIS — Z362 Encounter for other antenatal screening follow-up: Secondary | ICD-10-CM | POA: Insufficient documentation

## 2021-07-14 DIAGNOSIS — O99019 Anemia complicating pregnancy, unspecified trimester: Secondary | ICD-10-CM

## 2021-07-14 DIAGNOSIS — Z3400 Encounter for supervision of normal first pregnancy, unspecified trimester: Secondary | ICD-10-CM | POA: Insufficient documentation

## 2021-07-14 DIAGNOSIS — O99013 Anemia complicating pregnancy, third trimester: Secondary | ICD-10-CM | POA: Diagnosis not present

## 2021-07-14 DIAGNOSIS — O24410 Gestational diabetes mellitus in pregnancy, diet controlled: Secondary | ICD-10-CM

## 2021-07-14 DIAGNOSIS — O0930 Supervision of pregnancy with insufficient antenatal care, unspecified trimester: Secondary | ICD-10-CM | POA: Insufficient documentation

## 2021-07-14 DIAGNOSIS — Z3A32 32 weeks gestation of pregnancy: Secondary | ICD-10-CM | POA: Insufficient documentation

## 2021-07-14 DIAGNOSIS — Z3689 Encounter for other specified antenatal screening: Secondary | ICD-10-CM

## 2021-07-14 NOTE — Progress Notes (Signed)
Maternal-Fetal Medicine   Name: Victoria Conner DOB: Nov 05, 1995 MRN: [id number] Referring Provider: Raelyn Mora, CNM  I had the pleasure of seeing Ms. Dundon today at the [facility]. She is G1 P0 at 32w 3d gestation with a new diagnosis of gestational diabetes (GDM).  Patient has anemia and received iron infusions.  I counseled the patient with help of Swahili language interpreter present in the room. Her blood pressure today at her office is 116/78 mmHg.  Ultrasound Fetal growth is appropriate for gestational age.  Amniotic fluid is normal and good fetal activity seen.  Antenatal testing is reassuring.  BPP 8/8.  Gestational diabetes I explained the diagnosis of gestational diabetes.  Patient is not yet aware of the diagnosis. I discussed blood glucose monitoring and the patient has an appointment to meet with our diabetic educator on 07/16/21 to discuss GDM. I emphasized the importance of good blood glucose control to prevent adverse fetal or neonatal outcomes.  I discussed blood glucose normal values. I encouraged her to check her blood glucose regularly.  Possible complications of gestational diabetes include fetal macrosomia, shoulder dystocia and birth injuries, stillbirth (in poorly controlled diabetes) and neonatal respiratory syndrome and other complications.  In about 85% of cases, gestational diabetes is well controlled by diet alone.  Exercise reduces the need for insulin.  Medical treatment includes oral hypoglycemics or insulin. Since her control of diabetes is not established, I recommend weekly BPP till delivery.  Timing of delivery: In well-controlled diabetes on diet, patient can be delivered at 31- or 40-weeks' gestation. Vaginal delivery is not contraindicated. Type 2 diabetes develops in about 25% to 40% of women with GDM. I recommend postpartum screening with 75-g glucose load at 6 to 12 weeks after delivery. Recommendations -Diabetic education  on 07/16/21. -Appointments were made for weekly BPP.  Thank you for consultation.  If you have any questions or concerns, please contact me the [facility].  Consultation including face-to-face (more than 50%) counseling 20 minutes.

## 2021-07-15 ENCOUNTER — Other Ambulatory Visit: Payer: Self-pay | Admitting: *Deleted

## 2021-07-15 DIAGNOSIS — O0933 Supervision of pregnancy with insufficient antenatal care, third trimester: Secondary | ICD-10-CM

## 2021-07-15 DIAGNOSIS — Z3689 Encounter for other specified antenatal screening: Secondary | ICD-10-CM

## 2021-07-15 DIAGNOSIS — O24410 Gestational diabetes mellitus in pregnancy, diet controlled: Secondary | ICD-10-CM

## 2021-07-16 ENCOUNTER — Other Ambulatory Visit: Payer: Medicaid Other | Admitting: Registered"

## 2021-07-16 ENCOUNTER — Other Ambulatory Visit: Payer: Self-pay

## 2021-07-16 DIAGNOSIS — O24410 Gestational diabetes mellitus in pregnancy, diet controlled: Secondary | ICD-10-CM

## 2021-07-21 ENCOUNTER — Ambulatory Visit: Payer: Medicaid Other

## 2021-07-21 ENCOUNTER — Ambulatory Visit: Payer: Medicaid Other | Attending: Obstetrics and Gynecology

## 2021-07-22 ENCOUNTER — Ambulatory Visit (INDEPENDENT_AMBULATORY_CARE_PROVIDER_SITE_OTHER): Payer: Medicaid Other | Admitting: Certified Nurse Midwife

## 2021-07-22 ENCOUNTER — Other Ambulatory Visit: Payer: Self-pay

## 2021-07-22 ENCOUNTER — Other Ambulatory Visit: Payer: Medicaid Other

## 2021-07-22 VITALS — BP 129/90 | HR 98 | Wt 171.6 lb

## 2021-07-22 DIAGNOSIS — Z23 Encounter for immunization: Secondary | ICD-10-CM | POA: Diagnosis not present

## 2021-07-22 DIAGNOSIS — O0993 Supervision of high risk pregnancy, unspecified, third trimester: Secondary | ICD-10-CM | POA: Diagnosis not present

## 2021-07-22 DIAGNOSIS — Z3A33 33 weeks gestation of pregnancy: Secondary | ICD-10-CM

## 2021-07-22 DIAGNOSIS — I10 Essential (primary) hypertension: Secondary | ICD-10-CM

## 2021-07-22 DIAGNOSIS — O24419 Gestational diabetes mellitus in pregnancy, unspecified control: Secondary | ICD-10-CM

## 2021-07-22 MED ORDER — ACCU-CHEK GUIDE VI STRP: ORAL_STRIP | 3 refills | Status: DC

## 2021-07-22 MED ORDER — PRENATAL PLUS VITAMIN/MINERAL 27-1 MG PO TABS: 1.0000 | ORAL_TABLET | Freq: Every day | ORAL | 11 refills | Status: DC

## 2021-07-22 NOTE — Progress Notes (Signed)
° °  PRENATAL VISIT NOTE  Subjective:  Victoria Conner is a 26 y.o. G1P0000 at [redacted]w[redacted]d being seen today for ongoing prenatal care.  She is currently monitored for the following issues for this high-risk pregnancy and has Supervision of high risk pregnancy, antepartum; Late prenatal care; Anemia during pregnancy; Uterine size date discrepancy pregnancy, second trimester; Language barrier affecting health care; Abnormal chromosomal and genetic finding on antenatal screening mother; and Gestational diabetes on their problem list.  Patient reports no complaints.  Contractions: Not present. Vag. Bleeding: None.  Movement: Present. Denies leaking of fluid.   The following portions of the patient's history were reviewed and updated as appropriate: allergies, current medications, past family history, past medical history, past social history, past surgical history and problem list.   Objective:   Vitals:   07/22/21 0901  BP: 129/90  Pulse: 98  Weight: 171 lb 9.6 oz (77.8 kg)    Fetal Status:     Movement: Present     General:  Alert, oriented and cooperative. Patient is in no acute distress.  Skin: Skin is warm and dry. No rash noted.   Cardiovascular: Normal heart rate noted  Respiratory: Normal respiratory effort, no problems with respiration noted  Abdomen: Soft, gravid, appropriate for gestational age.  Pain/Pressure: Absent     Pelvic: Cervical exam deferred        Extremities: Normal range of motion.  Edema: None  Mental Status: Normal mood and affect. Normal behavior. Normal judgment and thought content.   Assessment and Plan:  Pregnancy: G1P0000 at [redacted]w[redacted]d 1. Supervision of high risk pregnancy in third trimester - Feeling regular and vigorous fetal movement - Prenatal Vit-Fe Fumarate-FA (PRENATAL PLUS VITAMIN/MINERAL) 27-1 MG TABS; Take 1 tablet by mouth daily.  Dispense: 30 tablet; Refill: 11 - Tdap vaccine greater than or equal to 7yo IM  2. [redacted] weeks gestation of pregnancy - Routine  OB care  3. Gestational diabetes mellitus (GDM), antepartum, gestational diabetes method of control unspecified - Has not been testing glucose because she never got test strips, has first education appointment on the 7th. Stressed importance of regular testing so she can adjust her diet accordingly. Reviewed risks associated with untreated gestational diabetes. Pt verbalized understanding. - glucose blood (ACCU-CHEK GUIDE) test strip; Use as instructed; check blood glucose 4 times daily (Patient not taking: Reported on 07/22/2021)  Dispense: 100 each; Refill: 3  4. Elevated blood pressure reading in office with diagnosis of hypertension - Elevated reading today, asymptomatic. No longer appropriate for care at [facility] so will transfer to [facility] for future visits. Explained importance of blood sugar control for prevention of hypertension and need for bloodwork if she has another elevated reading.   Preterm labor symptoms and general obstetric precautions including but not limited to vaginal bleeding, contractions, leaking of fluid and fetal movement were reviewed in detail with the patient. Please refer to After Visit Summary for other counseling recommendations.   Return in about 1 week (around 07/29/2021) for IN-PERSON, HOB/GTT.  Future Appointments  Date Time Provider [place]  07/28/2021 10:15 AM [facility]  07/29/2021  8:15 AM [facility] NST [facility]  07/29/2021 10:15 AM Radene Gunning, MD [facility]  08/05/2021  8:15 AM [facility] NST [facility]  08/05/2021 10:15 AM Caren Macadam, MD [facility]  08/11/2021  9:00 AM [facility] NURSE [facility]  08/11/2021  9:15 AM [facility] US2 [facility] [place]    Gabriel Carina, CNM

## 2021-07-25 NOTE — Progress Notes (Signed)
° °  PRENATAL VISIT NOTE  Subjective:  Victoria Conner is a 26 y.o. G1P0000 at [redacted]w[redacted]d being seen today for ongoing prenatal care.  She is currently monitored for the following issues for this high-risk pregnancy and has Supervision of high risk pregnancy, antepartum; Late prenatal care; Anemia during pregnancy; Uterine size date discrepancy pregnancy, second trimester; Language barrier affecting health care; Abnormal chromosomal and genetic finding on antenatal screening mother; and Gestational diabetes on their problem list.  Patient reports no complaints.  Contractions: Not present. Vag. Bleeding: None.  Movement: Present. Denies leaking of fluid.   The following portions of the patient's history were reviewed and updated as appropriate: allergies, current medications, past family history, past medical history, past social history, past surgical history and problem list.   Objective:   Vitals:   07/29/21 0854  BP: 128/88  Pulse: (!) 107  Weight: 169 lb (76.7 kg)    Fetal Status: Fetal Heart Rate (bpm): NST   Movement: Present     General:  Alert, oriented and cooperative. Patient is in no acute distress.  Skin: Skin is warm and dry. No rash noted.   Cardiovascular: Normal heart rate noted  Respiratory: Normal respiratory effort, no problems with respiration noted  Abdomen: Soft, gravid, appropriate for gestational age.  Pain/Pressure: Absent     Pelvic: Cervical exam deferred        Extremities: Normal range of motion.  Edema: None  Mental Status: Normal mood and affect. Normal behavior. Normal judgment and thought content.   Assessment and Plan:  Pregnancy: G1P0000 at [redacted]w[redacted]d 1. Gestational diabetes mellitus (GDM), antepartum, gestational diabetes method of control unspecified - Diabetes education was on 2/7 - she did not go to this appointment - Reviewed her limited CBGs today from her memory - she is doing some testing (3x a week) but she has not been writing them down. Log given to  patient and reviewed purpose of checking. Clarified normal testing is 4 times a day and 2 hr pp.  - Continue serial growth scan - last done on 1/24 and growth was 54%ile - She has NSTs scheduled - she is here for one today.   2. Supervision of high risk pregnancy, antepartum  3. Late prenatal care - Presented 12/8  4. Anemia during pregnancy - Improved to 11.2   5. Language barrier affecting health care - Swahili speaking - interpreter used throughout the appt (live)  Preterm labor symptoms and general obstetric precautions including but not limited to vaginal bleeding, contractions, leaking of fluid and fetal movement were reviewed in detail with the patient. Please refer to After Visit Summary for other counseling recommendations.   Return in about 2 weeks (around 08/12/2021) for OB VISIT, MD or APP.  Future Appointments  Date Time Provider [place]  07/29/2021  9:15 AM [facility] US1 [facility]  07/29/2021 10:15 AM Radene Gunning, MD [facility]  08/05/2021  8:15 AM [facility] NST [facility]  08/05/2021 10:15 AM Caren Macadam, MD [facility]  08/11/2021  9:00 AM [facility] NURSE [facility]  08/11/2021  9:15 AM [facility] US2 [facility]    Radene Gunning, MD

## 2021-07-28 ENCOUNTER — Other Ambulatory Visit: Payer: Medicaid Other

## 2021-07-29 ENCOUNTER — Ambulatory Visit (INDEPENDENT_AMBULATORY_CARE_PROVIDER_SITE_OTHER): Payer: Medicaid Other | Admitting: General Practice

## 2021-07-29 ENCOUNTER — Other Ambulatory Visit: Payer: Self-pay

## 2021-07-29 ENCOUNTER — Ambulatory Visit (INDEPENDENT_AMBULATORY_CARE_PROVIDER_SITE_OTHER): Payer: Medicaid Other | Admitting: Obstetrics and Gynecology

## 2021-07-29 ENCOUNTER — Ambulatory Visit (INDEPENDENT_AMBULATORY_CARE_PROVIDER_SITE_OTHER): Payer: Medicaid Other

## 2021-07-29 ENCOUNTER — Encounter: Payer: Self-pay | Admitting: Obstetrics and Gynecology

## 2021-07-29 VITALS — BP 128/88 | HR 107 | Wt 169.0 lb

## 2021-07-29 DIAGNOSIS — O24419 Gestational diabetes mellitus in pregnancy, unspecified control: Secondary | ICD-10-CM | POA: Diagnosis not present

## 2021-07-29 DIAGNOSIS — O99019 Anemia complicating pregnancy, unspecified trimester: Secondary | ICD-10-CM

## 2021-07-29 DIAGNOSIS — O0930 Supervision of pregnancy with insufficient antenatal care, unspecified trimester: Secondary | ICD-10-CM

## 2021-07-29 DIAGNOSIS — O0990 Supervision of high risk pregnancy, unspecified, unspecified trimester: Secondary | ICD-10-CM | POA: Diagnosis not present

## 2021-07-29 DIAGNOSIS — Z603 Acculturation difficulty: Secondary | ICD-10-CM

## 2021-07-29 DIAGNOSIS — Z789 Other specified health status: Secondary | ICD-10-CM

## 2021-07-29 DIAGNOSIS — O24410 Gestational diabetes mellitus in pregnancy, diet controlled: Secondary | ICD-10-CM

## 2021-07-29 NOTE — Progress Notes (Signed)
Pt informed that the ultrasound is considered a limited OB ultrasound and is not intended to be a complete ultrasound exam.  Patient also informed that the ultrasound is not being completed with the intent of assessing for fetal or placental anomalies or any pelvic abnormalities.  Explained that the purpose of today’s ultrasound is to assess for  BPP, presentation, and AFI.  Patient acknowledges the purpose of the exam and the limitations of the study.    ° °Michille Mcelrath H RN BSN °07/29/21 ° °

## 2021-08-05 ENCOUNTER — Other Ambulatory Visit: Payer: Self-pay

## 2021-08-05 ENCOUNTER — Encounter: Payer: Medicaid Other | Admitting: Family Medicine

## 2021-08-05 ENCOUNTER — Ambulatory Visit (INDEPENDENT_AMBULATORY_CARE_PROVIDER_SITE_OTHER): Payer: Medicaid Other

## 2021-08-05 ENCOUNTER — Ambulatory Visit (INDEPENDENT_AMBULATORY_CARE_PROVIDER_SITE_OTHER): Payer: Medicaid Other | Admitting: General Practice

## 2021-08-05 VITALS — BP 132/89 | HR 81

## 2021-08-05 DIAGNOSIS — O24410 Gestational diabetes mellitus in pregnancy, diet controlled: Secondary | ICD-10-CM | POA: Diagnosis not present

## 2021-08-05 NOTE — Progress Notes (Signed)
Pt informed that the ultrasound is considered a limited OB ultrasound and is not intended to be a complete ultrasound exam.  Patient also informed that the ultrasound is not being completed with the intent of assessing for fetal or placental anomalies or any pelvic abnormalities.  Explained that the purpose of todays ultrasound is to assess for  BPP, presentation, and AFI.  Patient acknowledges the purpose of the exam and the limitations of the study.     Koren Bound RN BSN 08/05/21

## 2021-08-06 ENCOUNTER — Encounter: Payer: Medicaid Other | Admitting: Obstetrics and Gynecology

## 2021-08-07 NOTE — Progress Notes (Signed)
Attestation of Attending Supervision of clinical support staff: I agree with the care provided to this patient and was available for any consultation.  I have reviewed the RN's note and chart. I was available for consult and to see the patient if needed.   Vladimir Lenhoff MD MPH Attending Physician Faculty Practice- [facility]  

## 2021-08-11 ENCOUNTER — Ambulatory Visit: Payer: Medicaid Other

## 2021-08-11 ENCOUNTER — Ambulatory Visit: Payer: Medicaid Other | Attending: Obstetrics and Gynecology

## 2021-08-19 ENCOUNTER — Other Ambulatory Visit: Payer: Self-pay

## 2021-08-19 ENCOUNTER — Inpatient Hospital Stay (HOSPITAL_COMMUNITY): Payer: Medicaid Other | Admitting: Anesthesiology

## 2021-08-19 ENCOUNTER — Encounter (HOSPITAL_COMMUNITY)
Admission: AD | Disposition: A | Discharge status: Home / Self Care | Payer: Self-pay | Source: Home / Self Care | Attending: Family Medicine

## 2021-08-19 ENCOUNTER — Inpatient Hospital Stay (HOSPITAL_COMMUNITY)
Admission: AD | Admit: 2021-08-19 | Discharge: 2021-08-21 | DRG: 787 | Disposition: A | Discharge status: Home / Self Care | Payer: Medicaid Other | Attending: Family Medicine | Admitting: Family Medicine

## 2021-08-19 ENCOUNTER — Ambulatory Visit (INDEPENDENT_AMBULATORY_CARE_PROVIDER_SITE_OTHER): Payer: Medicaid Other | Admitting: Family Medicine

## 2021-08-19 ENCOUNTER — Encounter: Payer: Self-pay | Admitting: Family Medicine

## 2021-08-19 ENCOUNTER — Encounter: Payer: Medicaid Other | Admitting: Certified Nurse Midwife

## 2021-08-19 ENCOUNTER — Encounter (HOSPITAL_COMMUNITY): Payer: Self-pay | Admitting: Family Medicine

## 2021-08-19 ENCOUNTER — Ambulatory Visit (INDEPENDENT_AMBULATORY_CARE_PROVIDER_SITE_OTHER): Payer: Medicaid Other

## 2021-08-19 VITALS — BP 136/95 | HR 77

## 2021-08-19 DIAGNOSIS — O139 Gestational [pregnancy-induced] hypertension without significant proteinuria, unspecified trimester: Secondary | ICD-10-CM

## 2021-08-19 DIAGNOSIS — O321XX0 Maternal care for breech presentation, not applicable or unspecified: Secondary | ICD-10-CM

## 2021-08-19 DIAGNOSIS — O99019 Anemia complicating pregnancy, unspecified trimester: Secondary | ICD-10-CM | POA: Diagnosis not present

## 2021-08-19 DIAGNOSIS — O24420 Gestational diabetes mellitus in childbirth, diet controlled: Secondary | ICD-10-CM | POA: Diagnosis present

## 2021-08-19 DIAGNOSIS — O0990 Supervision of high risk pregnancy, unspecified, unspecified trimester: Secondary | ICD-10-CM

## 2021-08-19 DIAGNOSIS — O9902 Anemia complicating childbirth: Secondary | ICD-10-CM

## 2021-08-19 DIAGNOSIS — O9081 Anemia of the puerperium: Secondary | ICD-10-CM | POA: Diagnosis not present

## 2021-08-19 DIAGNOSIS — Z20822 Contact with and (suspected) exposure to covid-19: Secondary | ICD-10-CM | POA: Diagnosis present

## 2021-08-19 DIAGNOSIS — O321XX1 Maternal care for breech presentation, fetus 1: Secondary | ICD-10-CM | POA: Diagnosis not present

## 2021-08-19 DIAGNOSIS — O134 Gestational [pregnancy-induced] hypertension without significant proteinuria, complicating childbirth: Principal | ICD-10-CM | POA: Diagnosis present

## 2021-08-19 DIAGNOSIS — Z789 Other specified health status: Secondary | ICD-10-CM | POA: Diagnosis present

## 2021-08-19 DIAGNOSIS — D508 Other iron deficiency anemias: Secondary | ICD-10-CM

## 2021-08-19 DIAGNOSIS — Z603 Acculturation difficulty: Secondary | ICD-10-CM | POA: Diagnosis present

## 2021-08-19 DIAGNOSIS — O329XX0 Maternal care for malpresentation of fetus, unspecified, not applicable or unspecified: Secondary | ICD-10-CM | POA: Diagnosis present

## 2021-08-19 DIAGNOSIS — O24429 Gestational diabetes mellitus in childbirth, unspecified control: Secondary | ICD-10-CM | POA: Diagnosis not present

## 2021-08-19 DIAGNOSIS — D62 Acute posthemorrhagic anemia: Secondary | ICD-10-CM | POA: Diagnosis not present

## 2021-08-19 DIAGNOSIS — O24410 Gestational diabetes mellitus in pregnancy, diet controlled: Secondary | ICD-10-CM

## 2021-08-19 DIAGNOSIS — Z3A37 37 weeks gestation of pregnancy: Secondary | ICD-10-CM

## 2021-08-19 DIAGNOSIS — Z98891 History of uterine scar from previous surgery: Secondary | ICD-10-CM

## 2021-08-19 DIAGNOSIS — O24419 Gestational diabetes mellitus in pregnancy, unspecified control: Secondary | ICD-10-CM | POA: Diagnosis present

## 2021-08-19 DIAGNOSIS — D649 Anemia, unspecified: Secondary | ICD-10-CM | POA: Diagnosis not present

## 2021-08-19 DIAGNOSIS — D509 Iron deficiency anemia, unspecified: Secondary | ICD-10-CM

## 2021-08-19 DIAGNOSIS — O0930 Supervision of pregnancy with insufficient antenatal care, unspecified trimester: Secondary | ICD-10-CM

## 2021-08-19 HISTORY — DX: Gestational (pregnancy-induced) hypertension without significant proteinuria, unspecified trimester: O13.9

## 2021-08-19 HISTORY — DX: Maternal care for breech presentation, not applicable or unspecified: O32.1XX0

## 2021-08-19 HISTORY — DX: History of uterine scar from previous surgery: Z98.891

## 2021-08-19 HISTORY — DX: Maternal care for malpresentation of fetus, unspecified, not applicable or unspecified: O32.9XX0

## 2021-08-19 LAB — CBC
HCT: 35 % — ABNORMAL LOW (ref 36.0–46.0)
Hemoglobin: 12.1 g/dL (ref 12.0–15.0)
MCH: 29.2 pg (ref 26.0–34.0)
MCHC: 34.6 g/dL (ref 30.0–36.0)
MCV: 84.5 fL (ref 80.0–100.0)
Platelets: 240 10*3/uL (ref 150–400)
RBC: 4.14 MIL/uL (ref 3.87–5.11)
RDW: 18 % — ABNORMAL HIGH (ref 11.5–15.5)
WBC: 6.6 10*3/uL (ref 4.0–10.5)
nRBC: 0 % (ref 0.0–0.2)

## 2021-08-19 LAB — COMPREHENSIVE METABOLIC PANEL
ALT: 15 U/L (ref 0–44)
AST: 27 U/L (ref 15–41)
Albumin: 2.2 g/dL — ABNORMAL LOW (ref 3.5–5.0)
Alkaline Phosphatase: 242 U/L — ABNORMAL HIGH (ref 38–126)
Anion gap: 9 (ref 5–15)
BUN: <5 mg/dL — ABNORMAL LOW (ref 6–20)
CO2: 19 mmol/L — ABNORMAL LOW (ref 22–32)
Calcium: 8.6 mg/dL — ABNORMAL LOW (ref 8.9–10.3)
Chloride: 107 mmol/L (ref 98–111)
Creatinine, Ser: 0.61 mg/dL (ref 0.44–1.00)
GFR, Estimated: >60 mL/min (ref >60)
Glucose, Bld: 73 mg/dL (ref 70–99)
Potassium: 4.3 mmol/L (ref 3.5–5.1)
Sodium: 135 mmol/L (ref 135–145)
Total Bilirubin: 1.2 mg/dL (ref 0.3–1.2)
Total Protein: 6.6 g/dL (ref 6.5–8.1)

## 2021-08-19 LAB — RESP PANEL BY RT-PCR (FLU A&B, COVID) ARPGX2
Influenza A by PCR: NEGATIVE
Influenza B by PCR: NEGATIVE
SARS Coronavirus 2 by RT PCR: NEGATIVE

## 2021-08-19 LAB — TYPE AND SCREEN
ABO/RH(D): AB POS
Antibody Screen: NEGATIVE

## 2021-08-19 LAB — GROUP B STREP BY PCR: Group B strep by PCR: NEGATIVE

## 2021-08-19 LAB — PROTEIN / CREATININE RATIO, URINE
Creatinine, Urine: 127.53 mg/dL
Protein Creatinine Ratio: 0.15 mg/mg{Cre} (ref 0.00–0.15)
Total Protein, Urine: 19 mg/dL

## 2021-08-19 LAB — GLUCOSE, CAPILLARY: Glucose-Capillary: 71 mg/dL (ref 70–99)

## 2021-08-19 LAB — RPR: RPR Ser Ql: NONREACTIVE

## 2021-08-19 SURGERY — Surgical Case
Anesthesia: Spinal

## 2021-08-19 SURGERY — Surgical Case

## 2021-08-19 MED ORDER — OXYCODONE HCL 5 MG PO TABS: 5.0000 mg | ORAL_TABLET | Freq: Once | ORAL | Status: DC | PRN

## 2021-08-19 MED ORDER — TERBUTALINE SULFATE 1 MG/ML IJ SOLN
0.2500 mg | Freq: Once | INTRAMUSCULAR | Status: AC
  Administered 2021-08-19: 0.25 mg via SUBCUTANEOUS

## 2021-08-19 MED ORDER — MEPERIDINE HCL 25 MG/ML IJ SOLN: 6.2500 mg | INTRAMUSCULAR | Status: DC | PRN

## 2021-08-19 MED ORDER — CEFAZOLIN SODIUM-DEXTROSE 2-4 GM/100ML-% IV SOLN
2.0000 g | INTRAVENOUS | Status: AC
  Administered 2021-08-19: 2 g via INTRAVENOUS

## 2021-08-19 MED ORDER — PRENATAL MULTIVITAMIN CH
1.0000 | ORAL_TABLET | Freq: Every day | ORAL | Status: DC
  Administered 2021-08-20 – 2021-08-21 (×2): 1 via ORAL
  Filled 2021-08-19 (×2): qty 1

## 2021-08-19 MED ORDER — OXYTOCIN-SODIUM CHLORIDE 30-0.9 UT/500ML-% IV SOLN
INTRAVENOUS | Status: AC
  Filled 2021-08-19: qty 500

## 2021-08-19 MED ORDER — FENTANYL CITRATE (PF) 100 MCG/2ML IJ SOLN
INTRAMUSCULAR | Status: AC
  Filled 2021-08-19: qty 2

## 2021-08-19 MED ORDER — ACETAMINOPHEN 325 MG PO TABS: 325.0000 mg | ORAL_TABLET | ORAL | Status: DC | PRN

## 2021-08-19 MED ORDER — LIDOCAINE HCL (PF) 1 % IJ SOLN
30.0000 mL | INTRAMUSCULAR | Status: DC | PRN
  Filled 2021-08-19: qty 30

## 2021-08-19 MED ORDER — LACTATED RINGERS IV SOLN: INTRAVENOUS | Status: DC

## 2021-08-19 MED ORDER — ACETAMINOPHEN 500 MG PO TABS
1000.0000 mg | ORAL_TABLET | Freq: Four times a day (QID) | ORAL | Status: DC
  Administered 2021-08-20 – 2021-08-21 (×4): 1000 mg via ORAL
  Filled 2021-08-19 (×6): qty 2

## 2021-08-19 MED ORDER — KETOROLAC TROMETHAMINE 30 MG/ML IJ SOLN
30.0000 mg | Freq: Four times a day (QID) | INTRAMUSCULAR | Status: AC | PRN
  Administered 2021-08-19 – 2021-08-20 (×2): 30 mg via INTRAVENOUS
  Filled 2021-08-19 (×2): qty 1

## 2021-08-19 MED ORDER — PHENYLEPHRINE HCL-NACL 20-0.9 MG/250ML-% IV SOLN
INTRAVENOUS | Status: AC
  Filled 2021-08-19: qty 250

## 2021-08-19 MED ORDER — OXYCODONE HCL 5 MG/5ML PO SOLN: 5.0000 mg | Freq: Once | ORAL | Status: DC | PRN

## 2021-08-19 MED ORDER — PHENYLEPHRINE HCL (PRESSORS) 10 MG/ML IV SOLN
INTRAVENOUS | Status: DC | PRN
  Administered 2021-08-19: 80 ug via INTRAVENOUS

## 2021-08-19 MED ORDER — SCOPOLAMINE 1 MG/3DAYS TD PT72
MEDICATED_PATCH | TRANSDERMAL | Status: AC
  Filled 2021-08-19: qty 1

## 2021-08-19 MED ORDER — SIMETHICONE 80 MG PO CHEW
80.0000 mg | CHEWABLE_TABLET | Freq: Three times a day (TID) | ORAL | Status: DC
  Administered 2021-08-20 – 2021-08-21 (×3): 80 mg via ORAL
  Filled 2021-08-19 (×3): qty 1

## 2021-08-19 MED ORDER — SOD CITRATE-CITRIC ACID 500-334 MG/5ML PO SOLN
30.0000 mL | ORAL | Status: AC
  Administered 2021-08-19: 30 mL via ORAL

## 2021-08-19 MED ORDER — TERBUTALINE SULFATE 1 MG/ML IJ SOLN
INTRAMUSCULAR | Status: AC
  Filled 2021-08-19: qty 1

## 2021-08-19 MED ORDER — OXYCODONE HCL 5 MG PO TABS: 5.0000 mg | ORAL_TABLET | ORAL | Status: DC | PRN

## 2021-08-19 MED ORDER — COCONUT OIL OIL
1.0000 | TOPICAL_OIL | Status: DC | PRN
Start: 2021-08-19 — End: 2021-08-21

## 2021-08-19 MED ORDER — DIPHENHYDRAMINE HCL 25 MG PO CAPS: 25.0000 mg | ORAL_CAPSULE | Freq: Four times a day (QID) | ORAL | Status: DC | PRN

## 2021-08-19 MED ORDER — ACETAMINOPHEN 160 MG/5ML PO SOLN: 325.0000 mg | ORAL | Status: DC | PRN

## 2021-08-19 MED ORDER — ACETAMINOPHEN 325 MG PO TABS: 650.0000 mg | ORAL_TABLET | ORAL | Status: DC | PRN

## 2021-08-19 MED ORDER — SOD CITRATE-CITRIC ACID 500-334 MG/5ML PO SOLN
ORAL | Status: AC
  Filled 2021-08-19: qty 30

## 2021-08-19 MED ORDER — KETOROLAC TROMETHAMINE 30 MG/ML IJ SOLN: 30.0000 mg | Freq: Four times a day (QID) | INTRAMUSCULAR | Status: AC | PRN

## 2021-08-19 MED ORDER — BUPIVACAINE IN DEXTROSE 0.75-8.25 % IT SOLN
INTRATHECAL | Status: DC | PRN
Start: 2021-08-19 — End: 2021-08-19
  Administered 2021-08-19: 1.4 mL via INTRATHECAL

## 2021-08-19 MED ORDER — MENTHOL 3 MG MT LOZG: 1.0000 | LOZENGE | OROMUCOSAL | Status: DC | PRN

## 2021-08-19 MED ORDER — MEASLES, MUMPS & RUBELLA VAC IJ SOLR: 0.5000 mL | Freq: Once | INTRAMUSCULAR | Status: DC

## 2021-08-19 MED ORDER — PHENYLEPHRINE HCL-NACL 20-0.9 MG/250ML-% IV SOLN
INTRAVENOUS | Status: DC | PRN
Start: 2021-08-19 — End: 2021-08-19
  Administered 2021-08-19: 60 ug/min via INTRAVENOUS

## 2021-08-19 MED ORDER — ONDANSETRON HCL 4 MG/2ML IJ SOLN: 4.0000 mg | Freq: Four times a day (QID) | INTRAMUSCULAR | Status: DC | PRN

## 2021-08-19 MED ORDER — FENTANYL CITRATE (PF) 100 MCG/2ML IJ SOLN: 25.0000 ug | INTRAMUSCULAR | Status: DC | PRN

## 2021-08-19 MED ORDER — NALOXONE HCL 4 MG/10ML IJ SOLN
1.0000 ug/kg/h | INTRAVENOUS | Status: DC | PRN
  Filled 2021-08-19: qty 5

## 2021-08-19 MED ORDER — SODIUM CHLORIDE 0.9% FLUSH: 3.0000 mL | INTRAVENOUS | Status: DC | PRN

## 2021-08-19 MED ORDER — SOD CITRATE-CITRIC ACID 500-334 MG/5ML PO SOLN: 30.0000 mL | ORAL | Status: DC | PRN

## 2021-08-19 MED ORDER — ZOLPIDEM TARTRATE 5 MG PO TABS: 5.0000 mg | ORAL_TABLET | Freq: Every evening | ORAL | Status: DC | PRN

## 2021-08-19 MED ORDER — MORPHINE SULFATE (PF) 0.5 MG/ML IJ SOLN
INTRAMUSCULAR | Status: DC | PRN
  Administered 2021-08-19: 150 ug via INTRAVENOUS

## 2021-08-19 MED ORDER — WITCH HAZEL-GLYCERIN EX PADS: 1.0000 "application " | MEDICATED_PAD | CUTANEOUS | Status: DC | PRN

## 2021-08-19 MED ORDER — OXYCODONE-ACETAMINOPHEN 5-325 MG PO TABS: 1.0000 | ORAL_TABLET | ORAL | Status: DC | PRN

## 2021-08-19 MED ORDER — IBUPROFEN 600 MG PO TABS
600.0000 mg | ORAL_TABLET | Freq: Four times a day (QID) | ORAL | Status: DC
  Administered 2021-08-20 – 2021-08-21 (×4): 600 mg via ORAL
  Filled 2021-08-19 (×5): qty 1

## 2021-08-19 MED ORDER — DIPHENHYDRAMINE HCL 25 MG PO CAPS: 25.0000 mg | ORAL_CAPSULE | ORAL | Status: DC | PRN

## 2021-08-19 MED ORDER — LACTATED RINGERS IV SOLN: 500.0000 mL | INTRAVENOUS | Status: DC | PRN

## 2021-08-19 MED ORDER — SENNOSIDES-DOCUSATE SODIUM 8.6-50 MG PO TABS
2.0000 | ORAL_TABLET | ORAL | Status: DC
  Administered 2021-08-20 – 2021-08-21 (×2): 2 via ORAL
  Filled 2021-08-19 (×2): qty 2

## 2021-08-19 MED ORDER — MORPHINE SULFATE (PF) 0.5 MG/ML IJ SOLN
INTRAMUSCULAR | Status: AC
Start: 2021-08-19 — End: ?
  Filled 2021-08-19: qty 10

## 2021-08-19 MED ORDER — ENOXAPARIN SODIUM 40 MG/0.4ML IJ SOSY
40.0000 mg | PREFILLED_SYRINGE | INTRAMUSCULAR | Status: DC
  Administered 2021-08-20 – 2021-08-21 (×2): 40 mg via SUBCUTANEOUS
  Filled 2021-08-19 (×2): qty 0.4

## 2021-08-19 MED ORDER — OXYTOCIN-SODIUM CHLORIDE 30-0.9 UT/500ML-% IV SOLN: 2.5000 [IU]/h | INTRAVENOUS | Status: DC

## 2021-08-19 MED ORDER — ONDANSETRON HCL 4 MG/2ML IJ SOLN
INTRAMUSCULAR | Status: DC | PRN
Start: 2021-08-19 — End: 2021-08-19
  Administered 2021-08-19: 4 mg via INTRAVENOUS

## 2021-08-19 MED ORDER — ONDANSETRON HCL 4 MG/2ML IJ SOLN: 4.0000 mg | Freq: Once | INTRAMUSCULAR | Status: DC | PRN

## 2021-08-19 MED ORDER — MIDAZOLAM HCL 2 MG/2ML IJ SOLN
INTRAMUSCULAR | Status: DC | PRN
  Administered 2021-08-19 (×3): .5 mg via INTRAVENOUS

## 2021-08-19 MED ORDER — OXYTOCIN-SODIUM CHLORIDE 30-0.9 UT/500ML-% IV SOLN
INTRAVENOUS | Status: DC | PRN
  Administered 2021-08-19: 400 mL via INTRAVENOUS

## 2021-08-19 MED ORDER — FLEET ENEMA 7-19 GM/118ML RE ENEM: 1.0000 | ENEMA | Freq: Every day | RECTAL | Status: DC | PRN

## 2021-08-19 MED ORDER — ACETAMINOPHEN 10 MG/ML IV SOLN
1000.0000 mg | Freq: Once | INTRAVENOUS | Status: AC
  Administered 2021-08-19: 1000 mg via INTRAVENOUS

## 2021-08-19 MED ORDER — DIBUCAINE (PERIANAL) 1 % EX OINT: 1.0000 "application " | TOPICAL_OINTMENT | CUTANEOUS | Status: DC | PRN

## 2021-08-19 MED ORDER — OXYCODONE-ACETAMINOPHEN 5-325 MG PO TABS: 2.0000 | ORAL_TABLET | ORAL | Status: DC | PRN

## 2021-08-19 MED ORDER — SODIUM CHLORIDE 0.9 % IR SOLN
Status: DC | PRN
  Administered 2021-08-19: 1000 mL

## 2021-08-19 MED ORDER — LACTATED RINGERS IV SOLN: INTRAVENOUS | Status: DC | PRN

## 2021-08-19 MED ORDER — OXYTOCIN-SODIUM CHLORIDE 30-0.9 UT/500ML-% IV SOLN
2.5000 [IU]/h | INTRAVENOUS | Status: AC
  Administered 2021-08-19 (×2): 2.5 [IU]/h via INTRAVENOUS
  Filled 2021-08-19: qty 500

## 2021-08-19 MED ORDER — SCOPOLAMINE 1 MG/3DAYS TD PT72
1.0000 | MEDICATED_PATCH | Freq: Once | TRANSDERMAL | Status: DC
  Administered 2021-08-19: 1.5 mg via TRANSDERMAL

## 2021-08-19 MED ORDER — SIMETHICONE 80 MG PO CHEW: 80.0000 mg | CHEWABLE_TABLET | ORAL | Status: DC | PRN

## 2021-08-19 MED ORDER — FENTANYL CITRATE (PF) 100 MCG/2ML IJ SOLN
INTRAMUSCULAR | Status: DC | PRN
  Administered 2021-08-19: 15 ug via INTRATHECAL

## 2021-08-19 MED ORDER — OXYTOCIN BOLUS FROM INFUSION: 333.0000 mL | Freq: Once | INTRAVENOUS | Status: DC

## 2021-08-19 MED ORDER — ONDANSETRON HCL 4 MG/2ML IJ SOLN
4.0000 mg | Freq: Three times a day (TID) | INTRAMUSCULAR | Status: DC | PRN
  Administered 2021-08-19: 4 mg via INTRAVENOUS
  Filled 2021-08-19: qty 2

## 2021-08-19 MED ORDER — BISACODYL 10 MG RE SUPP: 10.0000 mg | Freq: Every day | RECTAL | Status: DC | PRN

## 2021-08-19 MED ORDER — DIPHENHYDRAMINE HCL 50 MG/ML IJ SOLN: 12.5000 mg | INTRAMUSCULAR | Status: DC | PRN

## 2021-08-19 MED ORDER — CEFAZOLIN SODIUM-DEXTROSE 2-4 GM/100ML-% IV SOLN
INTRAVENOUS | Status: AC
  Filled 2021-08-19: qty 100

## 2021-08-19 MED ORDER — MIDAZOLAM HCL 2 MG/2ML IJ SOLN
INTRAMUSCULAR | Status: AC
  Filled 2021-08-19: qty 2

## 2021-08-19 MED ORDER — NALOXONE HCL 0.4 MG/ML IJ SOLN: 0.4000 mg | INTRAMUSCULAR | Status: DC | PRN

## 2021-08-19 MED ORDER — ACETAMINOPHEN 10 MG/ML IV SOLN
INTRAVENOUS | Status: AC
  Filled 2021-08-19: qty 100

## 2021-08-19 MED ORDER — DEXAMETHASONE SODIUM PHOSPHATE 4 MG/ML IJ SOLN
INTRAMUSCULAR | Status: DC | PRN
  Administered 2021-08-19: 4 mg via INTRAVENOUS

## 2021-08-19 MED ORDER — STERILE WATER FOR IRRIGATION IR SOLN
Status: DC | PRN
  Administered 2021-08-19: 1

## 2021-08-19 SURGICAL SUPPLY — 26 items
BENZOIN TINCTURE PRP APPL 2/3 (GAUZE/BANDAGES/DRESSINGS) ×1 IMPLANT
CHLORAPREP W/TINT 26ML (MISCELLANEOUS) ×2 IMPLANT
CLOTH BEACON ORANGE TIMEOUT ST (SAFETY) ×2 IMPLANT
DRSG OPSITE POSTOP 4X10 (GAUZE/BANDAGES/DRESSINGS) ×2 IMPLANT
ELECT REM PT RETURN 9FT ADLT (ELECTROSURGICAL) ×2
ELECTRODE REM PT RTRN 9FT ADLT (ELECTROSURGICAL) ×1 IMPLANT
GLOVE BIOGEL PI IND STRL 7.0 (GLOVE) ×3 IMPLANT
GLOVE BIOGEL PI INDICATOR 7.0 (GLOVE) ×3
GLOVE ECLIPSE 6.5 STRL STRAW (GLOVE) ×4 IMPLANT
GOWN STRL REUS W/ TWL LRG LVL3 (GOWN DISPOSABLE) ×2 IMPLANT
GOWN STRL REUS W/TWL LRG LVL3 (GOWN DISPOSABLE) ×3
NS IRRIG 1000ML POUR BTL (IV SOLUTION) ×2 IMPLANT
PAD OB MATERNITY 4.3X12.25 (PERSONAL CARE ITEMS) ×2 IMPLANT
PAD PREP 24X48 CUFFED NSTRL (MISCELLANEOUS) ×2 IMPLANT
RETRACTOR WND ALEXIS 25 LRG (MISCELLANEOUS) IMPLANT
RTRCTR WOUND ALEXIS 25CM LRG (MISCELLANEOUS) ×2
STRIP CLOSURE SKIN 1/2X4 (GAUZE/BANDAGES/DRESSINGS) ×1 IMPLANT
SUT MON AB-0 CT1 36 (SUTURE) ×1 IMPLANT
SUT PLAIN 2 0 XLH (SUTURE) ×2 IMPLANT
SUT VIC AB 0 CT1 36 (SUTURE) ×4 IMPLANT
SUT VIC AB 2-0 CT1 27 (SUTURE) ×1
SUT VIC AB 2-0 CT1 TAPERPNT 27 (SUTURE) ×1 IMPLANT
SUT VIC AB 4-0 KS 27 (SUTURE) ×2 IMPLANT
TOWEL OR 17X24 6PK STRL BLUE (TOWEL DISPOSABLE) ×6 IMPLANT
TRAY FOLEY CATH SILVER 16FR (SET/KITS/TRAYS/PACK) ×2 IMPLANT
WATER STERILE IRR 1000ML POUR (IV SOLUTION) ×2 IMPLANT

## 2021-08-19 NOTE — Anesthesia Procedure Notes (Addendum)
Spinal ? ?Patient location during procedure: OR ?Start time: 08/19/2021 4:32 PM ?End time: 08/19/2021 5:38 PM ?Reason for block: surgical anesthesia ?Staffing ?Anesthesiologist: Bethena Midget, MD ?Preanesthetic Checklist ?Completed: patient identified, IV checked, site marked, risks and benefits discussed, surgical consent, monitors and equipment checked, pre-op evaluation and timeout performed ?Spinal Block ?Patient position: sitting ?Prep: DuraPrep ?Patient monitoring: heart rate, cardiac monitor, continuous pulse ox and blood pressure ?Approach: midline ?Location: L3-4 ?Injection technique: single-shot ?Needle ?Needle type: Sprotte  ?Needle gauge: 24 G ?Needle length: 9 cm ?Assessment ?Sensory level: T4 ?Events: CSF return ? ? ? ?

## 2021-08-19 NOTE — H&P (Addendum)
Obstetric Preoperative History and Physical  Victoria Conner is a 25 y.o. G1P0000 with IUP at 6w4dpresenting for presenting for gHTN and uncontrolled with breech infant.  No acute concerns.   Used interpreter [employer]# [phone] history and initial consent for ECV (see A&P )  Prenatal Course Source of Care: [facility]  with onset of care at 20 weeks Pregnancy complications or risks: Patient Active Problem List   Diagnosis Date Noted   Malpresentation before onset of labor 08/19/2021   Gestational diabetes 06/28/2021   Uterine size date discrepancy pregnancy, second trimester 05/28/2021   Language barrier affecting health care 05/28/2021   Abnormal chromosomal and genetic finding on antenatal screening mother 05/28/2021   Anemia during pregnancy 05/14/2021   Supervision of high risk pregnancy, antepartum 05/13/2021   Late prenatal care 05/13/2021   She plans to breastfeed She desires no method for postpartum contraception.   Prenatal labs and studies: ABO, Rh: --/--/PENDING (03/01 1221) Antibody: PENDING (03/01 1221) Rubella: 6.05 (11/23 1424) RPR: Non Reactive (01/05 0830)  HBsAg: Negative (11/23 1424)  HIV: Non Reactive (01/05 0830)  GBS:  2hr Glucola  FAILED-- GDM Genetic screening normal- female  Anatomy [place]ormal  Prenatal Transfer Tool  Maternal Diabetes: Yes:  Diabetes Type:  Diet controlled- uncontrolled by review of BG Genetic Screening: Normal Maternal Ultrasounds/Referrals: Normal Fetal Ultrasounds or other Referrals:  None Maternal Substance Abuse:  No Significant Maternal Medications:  None Significant Maternal Lab Results: Other: GBS collected today  Past Medical History:  Diagnosis Date   Medical history non-contributory     Past Surgical History:  Procedure Laterality Date   NO PAST SURGERIES      OB History  Gravida Para Term Preterm AB Living  1 0 0 0 0 0  SAB IAB Ectopic Multiple Live Births  0 0 0 0 0    # Outcome Date GA Lbr  Len/2nd Weight Sex Delivery Anes PTL Lv  1 Current             Social History   Socioeconomic History   Marital status: Married    Spouse name: JBuyer, retail  Number of children: Not on file   Years of education: Not on file   Highest education level: 8th grade  Occupational History   Occupation: UNEMPLOYED  Tobacco Use   Smoking status: Never    Passive exposure: Never   Smokeless tobacco: Never  Vaping Use   Vaping Use: Never used  Substance and Sexual Activity   Alcohol use: Never   Drug use: Never   Sexual activity: Yes  Other Topics Concern   Not on file  Social History Narrative   Not on file   Social Determinants of Health   Financial Resource Strain: Not on file  Food Insecurity: No Food Insecurity   Worried About Running Out of Food in the Last Year: Never true   Ran Out of Food in the Last Year: Never true  Transportation Needs: No Transportation Needs   Lack of Transportation (Medical): No   Lack of Transportation (Non-Medical): No  Physical Activity: Not on file  Stress: Not on file  Social Connections: Not on file    Family History  Problem Relation Age of Onset   Hypertension Mother     Medications Prior to Admission  Medication Sig Dispense Refill Last Dose   Prenatal Vit-Fe Fumarate-FA (PRENATAL PLUS VITAMIN/MINERAL) 27-1 MG TABS Take 1 tablet by mouth daily. 30 tablet 11 08/18/2021   Accu-Chek Softclix Lancets lancets  Use as instructed 100 each 12    Blood Glucose Monitoring Suppl (ACCU-CHEK GUIDE) w/Device KIT 1 each by Does not apply route 4 (four) times daily. 1 kit 0    glucose blood (ACCU-CHEK GUIDE) test strip Use as instructed; check blood glucose 4 times daily 100 each 3     No Known Allergies  Review of Systems: Negative except for what is mentioned in HPI.  Physical Exam: BP (!) 140/96    Pulse 85    Temp 98.2 F (36.8 C) (Oral)    Resp 18    Ht _0  (1.549 m)    Wt 76.7 kg    LMP 11/29/2020 (Exact Date)    SpO2 100%    BMI 31.95  kg/m  FHR by Doppler: 127 bpm  EFM: 130/mod/+accels, no decels  CONSTITUTIONAL: Well-developed, well-nourished female in no acute distress.  HENT:  Normocephalic, atraumatic, External right and left ear normal. Oropharynx is clear and moist EYES: Conjunctivae and EOM are normal. Pupils are equal, round, and reactive to light. No scleral icterus.  NECK: Normal range of motion, supple, no masses SKIN: Skin is warm and dry. No rash noted. Not diaphoretic. No erythema. No pallor. [place]: Alert and oriented to person, place, and time. Normal reflexes, muscle tone coordination. No cranial nerve deficit noted. PSYCHIATRIC: Normal mood and affect. Normal behavior. Normal judgment and thought content. CARDIOVASCULAR: Normal heart rate noted, regular rhythm RESPIRATORY: Effort and breath sounds normal, no problems with respiration noted ABDOMEN: Soft, nontender, nondistended, gravid.  PELVIC: Deferred MUSCULOSKELETAL: Normal range of motion. No edema and no tenderness. 2+ distal pulses.   Pertinent Labs/Studies:   Results for orders placed or performed during the hospital encounter of 08/19/21 (from the past 72 hour(s))  Glucose, capillary     Status: None   Collection Time: 08/19/21 11:22 AM  Result Value Ref Range   Glucose-Capillary 71 70 - 99 mg/dL    Comment: Glucose reference range applies only to samples taken after fasting for at least 8 hours.  Resp Panel by RT-PCR (Flu A&B, Covid) Nasopharyngeal Swab     Status: None   Collection Time: 08/19/21 11:29 AM   Specimen: Nasopharyngeal Swab; Nasopharyngeal(NP) swabs in vial transport medium  Result Value Ref Range   SARS Coronavirus 2 by RT PCR NEGATIVE NEGATIVE    Comment: (NOTE) SARS-CoV-2 target nucleic acids are NOT DETECTED.  The SARS-CoV-2 RNA is generally detectable in upper respiratory specimens during the acute phase of infection. The lowest concentration of SARS-CoV-2 viral copies this assay can detect is 138 copies/mL. A  negative result does not preclude SARS-Cov-2 infection and should not be used as the sole basis for treatment or other patient management decisions. A negative result may occur with  improper specimen collection/handling, submission of specimen other than nasopharyngeal swab, presence of viral mutation(s) within the areas targeted by this assay, and inadequate number of viral copies(<138 copies/mL). A negative result must be combined with clinical observations, patient history, and epidemiological information. The expected result is Negative.  Fact Sheet for Patients:  EntrepreneurPulse.com.au  Fact Sheet for Healthcare Providers:  IncredibleEmployment.be  This test is no t yet approved or cleared by the [place] FDA and  has been authorized for detection and/or diagnosis of SARS-CoV-2 by FDA under an Emergency Use Authorization (EUA). This EUA will remain  in effect (meaning this test can be used) for the duration of the COVID-19 declaration under Section 564(b)(1) of the Act, 21 U.S.C.section 360bbb-3(b)(1), unless the authorization is  terminated  or revoked sooner.       Influenza A by PCR NEGATIVE NEGATIVE   Influenza B by PCR NEGATIVE NEGATIVE    Comment: (NOTE) The Xpert Xpress SARS-CoV-2/FLU/RSV plus assay is intended as an aid in the diagnosis of influenza from Nasopharyngeal swab specimens and should not be used as a sole basis for treatment. Nasal washings and aspirates are unacceptable for Xpert Xpress SARS-CoV-2/FLU/RSV testing.  Fact Sheet for Patients: EntrepreneurPulse.com.au  Fact Sheet for Healthcare Providers: IncredibleEmployment.be  This test is not yet approved or cleared by the [place] FDA and has been authorized for detection and/or diagnosis of SARS-CoV-2 by FDA under an Emergency Use Authorization (EUA). This EUA will remain in effect (meaning this test can be used)  for the duration of the COVID-19 declaration under Section 564(b)(1) of the Act, 21 U.S.C. section 360bbb-3(b)(1), unless the authorization is terminated or revoked.  Performed at [facility], [place] [address] [place], [place] [address]   CBC     Status: Abnormal   Collection Time: 08/19/21 11:47 AM  Result Value Ref Range   WBC 6.6 4.0 - 10.5 K/uL   RBC 4.14 3.87 - 5.11 MIL/uL   Hemoglobin 12.1 12.0 - 15.0 g/dL   HCT 35.0 (L) 36.0 - 46.0 %   MCV 84.5 80.0 - 100.0 fL   MCH 29.2 26.0 - 34.0 pg   MCHC 34.6 30.0 - 36.0 g/dL   RDW 18.0 (H) 11.5 - 15.5 %   Platelets 240 150 - 400 K/uL   nRBC 0.0 0.0 - 0.2 %    Comment: Performed at [facility], [place] [address], [place], [place] [address]  Type and screen [place]     Status: None (Preliminary result)   Collection Time: 08/19/21 12:21 PM  Result Value Ref Range   ABO/RH(D) PENDING    Antibody Screen PENDING    Sample Expiration      08/22/2021,2359 Performed at [facility], [place] [address] [place], [place] [address]     Assessment and Plan:  Arie Gable is a 26 y.o. G1P0000 at 55w4dbeing admitted being admitted for gHTN with breech infant.   After informed verbal consent, patient signed consent form at 11:55AM with patient.  Used interpreter services [employer]ie #[phone].  1:01 PM Partner arrived and we had another informed consent discussion and reviewed again the risks of ECV which includes patient intolerance/discomfort, fetal intolerance - intermittent or continuous and if continuous > 5 min we would proceed with emergent C-section, placental abruption which again would lead to an emergent C-section. We additionally discussed that the ECV might not work and this would lead to a non-emergent C-section. We also reviewed the indication for delivery regardless given gestational hypertension and uncontrolled GDM.  Husband wanted to call the patient's sister to discuss the care and the plan.   Conversation used interpreter services JYsidro [employer]vert#[phone]  1:40 PM Family is ready to proceed with the procedure. Again reviewed risks of the procedure with Interpreter [employer]#[phone] BGianelleagreed to proceed. [place] interpreted throughout procedure.  RN administered Terbutaline 0.25 mg SQ  Patient was placed in the supine position. ECV was attempted under Ultrasound guidance. With the help of Dr. CVilma Meckelwe attempted a backward roll and fetal head moved from RUQ to transverse/side but would not move further. We then attempted a forward roll with movement of fetal head from transverse maternal right to LUQ but after multiple attempts I was  unable to convert to vertex. The fetal heart rate was checked four times throughout attempts and was in the 130s each time. Infant tolerated attempts and mother did as well.   ECV was unsuccessfully performed.   Discussed with family the recommendation for delivery today by C-section due to gestational hypertension and uncontrolled gestational diabetes. We reviewed in detail the risks of the procedure.   The risks of cesarean section discussed with the patient included but were not limited to: bleeding which may require transfusion or reoperation; infection which may require antibiotics; injury to bowel, bladder, ureters or other surrounding organs; injury to the fetus; need for additional procedures including hysterectomy in the event of a life-threatening hemorrhage; placental abnormalities wth subsequent pregnancies, incisional problems, thromboembolic phenomenon and other postoperative/anesthesia complications.   The patient's husband Jumanne asked multiple times why we cannot wait "until 9 months and her labor pains come." I reviewed in detail the risks associated with gestational hypertension which include but are not limited to preeclampsia, eclampsia (seizures), stroke, abruption and stillbirth. The husband continued to insist that he wanted "to take  her home and wait for labor" and then "we could do the C-section."  I informed the patient and husband that she is having contractions and she is in early labor. The husband asked if we can start medications to lower her blood pressure. I stated that we do have medications but we do not treat blood pressures at this many months of the pregnancy and we recommend delivery. The patient was deferring to her husband throughout.   I firmly stated that my medical recommendation is for a cesarean section today due to elevated blood pressure/gestational hypertension and if they leave it is against my medical advise. I firmly stated we cannot force her to have cesarean section and it her choice whether she has a c-section and I could strongly recommend a cesarean delivery today  The husband and patient want to call a support person and asked for 15 minutes to discuss the plan with them.   3:04 PM Reviewed plan for C-section with use of interpreter services Philippe [phone]. Patient and husband are comfortable proceeding with primary c-section for breech presentation and gestational hypertension at term. Reviewed again the risks of infection, bleeding, damage to other organs. Patient signed consent form.   The patient concurred with the proposed plan, giving informed written consent for the procedure. Patient has been NPO since last night she will remain NPO for procedure. Anesthesia and OR aware. Preoperative prophylactic antibiotics and SCDs ordered on call to the OR. To OR when ready.   #Uncontrolled GDM: BG today is 71. Continue to monitor. She was not checking sugars regularly.  #gHTN: meets criteria for gHTN currently with BP.  Plan for lasix starting on POD#2 for PEC prevention.   Caren Macadam, MD, MPH, ABFM Attending Physician Center for [facility]

## 2021-08-19 NOTE — Progress Notes (Signed)
Called by RN due to concern and continued questions from patient and husband about "why they cannot wait until labor starts for the c-section" ? ?Using interpreter services, I had a conversation about why we are recommending that the patient be delivered today. These indications were clearly stated to the patient  ?1) Gestational Hypertension ?2) Uncontrolled gestational diabetes ?3) Breech presentation at term after failed ECV.  ? ?The family was asking why she could not "wait 9 months" and I stated that the patient has completed 36 weeks which is 9 months and at [redacted]w[redacted]d the pregnancy has completed 9 months and that baby is ready for birth.  ? ?The patient affirmatively stated she understands why she needs a c-section and affirmed that she would like to proceed. She stated she is hungry and I told her that after the surgery we will progress her diet.  ? ?I asked what additional questions the patient or husband had and they stated none.  ? ?Federico Flake, MD, MPH, ABFM, IBCLC ?Attending Physician ?Center for [facility] ? ?

## 2021-08-19 NOTE — Progress Notes (Signed)
? ?  Subjective:  ?Ciela Mahajan is a 26 y.o. G1P0000 at [redacted]w[redacted]d being seen today for ongoing prenatal care.  She is currently monitored for the following issues for this high-risk pregnancy and has Supervision of high risk pregnancy, antepartum; Late prenatal care; Anemia during pregnancy; Uterine size date discrepancy pregnancy, second trimester; Language barrier affecting health care; Abnormal chromosomal and genetic finding on antenatal screening mother; and Gestational diabetes on their problem list. ? ?Patient reports no complaints.  Contractions: Not present. Vag. Bleeding: None.  Movement: Present. Denies leaking of fluid.  ? ?The following portions of the patient's history were reviewed and updated as appropriate: allergies, current medications, past family history, past medical history, past social history, past surgical history and problem list. Problem list updated. ? ?Objective:  ? ?Vitals:  ? 08/19/21 0909 08/19/21 0911  ?BP: (!) 137/97 (!) 136/95  ?Pulse: 77   ? ? ?Fetal Status: Fetal Heart Rate (bpm): NST   Movement: Present    ? ?General:  Alert, oriented and cooperative. Patient is in no acute distress.  ?Skin: Skin is warm and dry. No rash noted.   ?Cardiovascular: Normal heart rate noted  ?Respiratory: Normal respiratory effort, no problems with respiration noted  ?Abdomen: Soft, gravid, appropriate for gestational age. Pain/Pressure: Absent     ?Pelvic: Vag. Bleeding: None     ?Cervical exam deferred        ?Extremities: Normal range of motion.  Edema: None  ?Mental Status: Normal mood and affect. Normal behavior. Normal judgment and thought content.  ? ?Urinalysis:     ? ?Assessment and Plan:  ?Pregnancy: G1P0000 at [redacted]w[redacted]d ? ?1. Diet controlled gestational diabetes mellitus (GDM) in third trimester ?Reportedly uncontrolled, did not review log at this visit ?BPP was 8/10 per RN, -2 for breathing ?- US FETAL BPP W/NONSTRESS; Future ? ?2. Supervision of high risk pregnancy, antepartum ?BP elevated,  NST reactive ?On [place] fetus is in frank breech position ?Given patient's elevated BP, likely uncontrolled GDM, and fetal malpositioning I recommend she go to L&D ?Discussed option of ECV vs pLTCS, she definitely wants to try for an ECV first with understanding that frank breech makes this less likely to be successful ?Fortunately AFI is 16 ?Spoke with Dr. Alvester Morin, she will notify hospital staff that patient is coming  ? ?3. Language barrier affecting health care ?Swahili interpreter Ezequiel Essex present throughout visit ? ?4. Anemia during pregnancy ?Resolved on most recent CBC ? ?5. Gestational hypertension ?Meets this diagnosis at a minimum ?To L&D for ECV+IOL or PCS ? ?Term labor symptoms and general obstetric precautions including but not limited to vaginal bleeding, contractions, leaking of fluid and fetal movement were reviewed in detail with the patient. ?Please refer to After Visit Summary for other counseling recommendations.  ?Return in about 6 weeks (around 09/30/2021) for PP check. ? ? ?Venora Maples, MD ? ?

## 2021-08-19 NOTE — Progress Notes (Signed)
Pt CBG: 76 @ 1915 ?-Dyke Brackett ?

## 2021-08-19 NOTE — Op Note (Signed)
[name] ?PROCEDURE DATE: 08/19/2021 ? ?PREOPERATIVE DIAGNOSES: Intrauterine pregnancy at [redacted]w[redacted]d weeks gestation;  Breech (failed ECV) with gestational hypertension and uncontrolled gestational diabetes  ? ?POSTOPERATIVE DIAGNOSES: The same ? ?PROCEDURE: Primary Low Transverse Cesarean Section ? ?SURGEON:  Caren Macadam  ? ?ANESTHESIOLOGY TEAM: Anesthesiologist: Janeece Riggers, MD ?CRNA: Sandrea Matte, CRNA ? ?INDICATIONS: Victoria Conner is a 26 y.o. G1P0000 at [redacted]w[redacted]d here for cesarean section secondary to the indications listed under preoperative diagnoses; please see preoperative note for further details.  The risks of cesarean section were discussed with the patient including but were not limited to: bleeding which may require transfusion or reoperation; infection which may require antibiotics; injury to bowel, bladder, ureters or other surrounding organs; injury to the fetus; need for additional procedures including hysterectomy in the event of a life-threatening hemorrhage; placental abnormalities wth subsequent pregnancies, incisional problems, thromboembolic phenomenon and other postoperative/anesthesia complications.   The patient concurred with the proposed plan, giving informed written consent for the procedure.   ? ?FINDINGS:  Viable female infant in breech presentation with nuchal x 1- delivered through.  Apgars APGAR (1 MIN): 8   ?APGAR (5 MINS): 9   ?Clear amniotic fluid.  Intact placenta, three vessel cord.  Normal uterus, fallopian tubes and ovaries bilaterally. ? ?ANESTHESIA: Spinal ?INTRAVENOUS FLUIDS: 1400 ml   ?ESTIMATED BLOOD LOSS: 412 ml ?URINE OUTPUT:  25 ml ?SPECIMENS: Placenta to pathology ?COMPLICATIONS: None immediate ? ?PROCEDURE IN DETAIL:  The patient preoperatively received intravenous antibiotics and had sequential compression devices applied to her lower extremities.  She was then taken to the operating room where spinal anesthesia was administered was dosed up to  surgical level and was found to be adequate. She was then placed in a dorsal supine position with a leftward tilt, and prepped and draped in a sterile manner.  A foley catheter was placed into her bladder and attached to constant gravity.  After an adequate timeout was performed, a Pfannenstiel skin incision was made with scalpel and carried through to the underlying layer of fascia. The fascia was incised in the midline, and this incision was extended bilaterally bluntly.  Kocher clamps were applied to the superior aspect of the fascial incision and the underlying rectus muscles were dissected off bluntly and sharply.  The rectus muscles were separated in the midline and the peritoneum was entered bluntly. The Alexis self-retaining retractor was introduced into the abdominal cavity.  Attention was turned to the lower uterine segment where a low transverse hysterotomy was made with a scalpel and extended bilaterally bluntly.  The infant was successfully delivered in the typical fashion for breech presentation, the cord was clamped and cut after one minute, and the infant was handed over to the awaiting neonatology team. Uterine massage was then administered, and the placenta delivered intact with a three-vessel cord. The uterus was then cleared of clots and debris.  The hysterotomy was closed with 0 Vicryl in a running locked fashion, and an imbricating layer was also placed with 0 Vicryl.  Figure-of-eight 0 Vicryl serosal stitches were placed to help with hemostasis on the left aspect of the hysterotomy.  The pelvis was cleared of all clot and debris. Hemostasis was confirmed on all surfaces.  The retractor was removed.  The peritoneum was closed with a 0 Vicryl running stitch. The fascia was then closed using 0 Monocryl in a running fashion.  The subcutaneous layer was reapproximated with 2-0 plain gut interrupted stitches, and the skin was closed with a 4-0  Vicryl subcuticular stitch. The patient tolerated the  procedure well. Sponge, instrument and needle counts were correct x 3.  She was taken to the recovery room in stable condition.  ? ?Caren Macadam, MD ?Center for [place] ? ?

## 2021-08-19 NOTE — Lactation Note (Signed)
This note was copied from a baby's chart. ?Lactation Consultation Note ?Mom speaks Swhali. Used [employer] interpreter Drema Balzarine [phone]. ?Mom was BF when LC came to visit mom.  ?Baby was BF well. Mom stated it hurt a little bit. LC checked flange and the baby had wide open flange, just adjusted cheeks to breast. Mom kept pulling back on breast, encouraged not to do that. ?Baby had good body alignment. ?D/t being c/s mom has limited mobility. ?FOB took LPI information sheet about supplementing which baby is given more than required. ?Asked mom if she would like to use DEBP mom didn't want to today. Suggested using for extra stimulation d/t baby less than 6 lbs. Mom agreed to do it tomorrow because she has been so sick tonight and needed to rest.  ?LC set up DEBP for mom to use tomorrow. ?Mom knows to pump q3h for 15-20 min. Mom encouraged to feed baby 8-12 times/24 hours and with feeding cues.   ?Encouraged mom to BF before giving formula. ?Encouraged mom to call for latch assistance or questions. ? ?Patient Name: Victoria Conner ?Today's Date: 08/19/2021 ?Reason for consult: Initial assessment;Early term 37-38.6wks;Infant < 6lbs;Primapara;Maternal endocrine disorder ?Age:36 hours ? ?Maternal Data ?Has patient been taught Hand Expression?: Yes ?Does the patient have breastfeeding experience prior to this delivery?: No ? ?Feeding ?Nipple Type: Slow - flow ? ?LATCH Score ?Latch: Grasps breast easily, tongue down, lips flanged, rhythmical sucking. ? ?Audible Swallowing: None ? ?Type of Nipple: Flat ? ?Comfort (Breast/Nipple): Soft / non-tender ? ?Hold (Positioning): Assistance needed to correctly position infant at breast and maintain latch. ? ?LATCH Score: 6 ? ? ?Lactation Tools Discussed/Used ?Tools: Pump ?Breast pump type: Double-Electric Breast Pump ?Pump Education: Setup, frequency, and cleaning;Milk Storage ?Reason for Pumping: less than 6 lbs ?Pumping frequency: q 3hrs ? ?Interventions ?Interventions: Breast feeding  basics reviewed;Assisted with latch;Skin to skin;Breast massage;Hand express;Breast compression;DEBP;Pace feeding;LC Services brochure ? ?Discharge ?Pump: DEBP ?WIC Program: Yes ? ?Consult Status ?Consult Status: Follow-up ?Date: 08/20/21 ?Follow-up type: In-patient ? ? ? ?Charyl Dancer ?08/19/2021, 10:12 PM ? ? ? ?

## 2021-08-19 NOTE — Anesthesia Preprocedure Evaluation (Signed)
Anesthesia Evaluation  ?Patient identified by MRN, date of birth, ID band ?Patient awake ? ? ? ?Reviewed: ?Allergy & Precautions, H&P , NPO status , Patient's Chart, lab work & pertinent test results, reviewed documented beta blocker date and time  ? ?Airway ?Mallampati: III ? ?TM Distance: >3 FB ?Neck ROM: full ? ? ? Dental ?no notable dental hx. ? ?  ?Pulmonary ?neg pulmonary ROS,  ?  ?Pulmonary exam normal ?breath sounds clear to auscultation ? ? ? ? ? ? Cardiovascular ?negative cardio ROS ?Normal cardiovascular exam ?Rhythm:regular Rate:Normal ? ? ?  ?Neuro/Psych ?negative neurological ROS ? negative psych ROS  ? GI/Hepatic ?negative GI ROS, Neg liver ROS,   ?Endo/Other  ?diabetes, Gestational ? Renal/GU ?negative Renal ROS  ?negative genitourinary ?  ?Musculoskeletal ? ? Abdominal ?  ?Peds ? Hematology ? ?(+) Blood dyscrasia, anemia ,   ?Anesthesia Other Findings ? ? Reproductive/Obstetrics ?(+) Pregnancy ? ?  ? ? ? ? ? ? ? ? ? ? ? ? ? ?  ?  ? ? ? ? ? ? ? ? ?Anesthesia Physical ?Anesthesia Plan ? ?ASA: 2 and emergent ? ?Anesthesia Plan: Spinal  ? ?Post-op Pain Management: Minimal or no pain anticipated  ? ?Induction: Intravenous ? ?PONV Risk Score and Plan: 2 and Scopolamine patch - Pre-op ? ?Airway Management Planned: Natural Airway ? ?Additional Equipment: None ? ?Intra-op Plan:  ? ?Post-operative Plan:  ? ?Informed Consent: I have reviewed the patients History and Physical, chart, labs and discussed the procedure including the risks, benefits and alternatives for the proposed anesthesia with the patient or authorized representative who has indicated his/her understanding and acceptance.  ? ? ? ? ? ?Plan Discussed with: Anesthesiologist and CRNA ? ?Anesthesia Plan Comments:   ? ? ? ? ? ? ?Anesthesia Quick Evaluation ? ?

## 2021-08-19 NOTE — Discharge Summary (Signed)
? ?  Postpartum Discharge Summary ? ?Date of Service updated ? ?   ?Patient Name: Victoria Conner ?DOB: 10-19-1995 ?MRN: [id number] ? ?Date of admission: 08/19/2021 ?Delivery date:08/19/2021  ?Delivering provider: Lauretta Chester NILES  ?Date of discharge: 08/21/2021 ? ?Admitting diagnosis: Malpresentation before onset of labor [O32.9XX0] ?Breech presentation, single or unspecified fetus [O32.1XX0] ?Intrauterine pregnancy: [redacted]w[redacted]d    ?Secondary diagnosis:  Principal Problem: ?  S/P primary low transverse C-section ?Active Problems: ?  Supervision of high risk pregnancy, antepartum ?  Late prenatal care ?  Anemia during pregnancy ?  Language barrier affecting health care ?  Gestational diabetes ?  Malpresentation before onset of labor ?  Breech presentation, single or unspecified fetus ?  Gestational hypertension ?  Iron deficiency anemia ? ?Additional problems:     ?Discharge diagnosis: Term Pregnancy Delivered                                              ?Post partum procedures: None ?Augmentation: N/A ?Complications: None ? ?Hospital course: Sceduled C/S -  26y.o. yo G1P1001 at 393w4das admitted to the hospital 08/19/2021 for ECV due to frank breech presentation. ECV was ultimately unsuccessful, therefore, patient went for scheduled cesarean section with the following indication: Malpresentation (frank breech). Delivery details are as follows:  ?Membrane Rupture Time/Date: 4:56 PM ,08/19/2021   ?Delivery Method:C-Section, Low Transverse  ?Details of operation can be found in separate operative note.  Patient had an uncomplicated postpartum course.  Her hemoglobin on POD#1 was 9.7, for which she received ferrous fumarate. Her fasting CBG was 79. Her blood pressures remained WNL but started on lasix for gestational hypertension. She is ambulating, tolerating a regular diet, passing flatus, and urinating well. Patient is discharged home in stable condition on  08/21/21 ?       ?Newborn Data: ?Birth date:08/19/2021  ?Birth  time:4:58 PM  ?Gender:Female  ?Living status:Living  ?Apgars:8 ,9  ?Weight:2560 g    ? ?Magnesium Sulfate received: No ?BMZ received: No ?Rhophylac: N/A ?MMR: N/A ?T-DaP: Given prenatally ?Flu: No ?Transfusion: No  ? ?Physical exam  ?Vitals:  ? 08/20/21 1001 08/20/21 1434 08/20/21 2100 08/21/21 0530  ?BP: 111/62 114/76 (!) 110/59 115/80  ?Pulse: 85 84 80 93  ?Resp: '18 18 17 18  ' ?Temp: 98.7 ?F (37.1 ?C) 97.7 ?F (36.5 ?C) 97.9 ?F (36.6 ?C) 98.2 ?F (36.8 ?C)  ?TempSrc: Oral Axillary Oral Oral  ?SpO2: 98%     ?Weight:      ?Height:      ? ?General: alert, cooperative, and no distress ?Lochia: appropriate ?Uterine Fundus: firm ?Incision: No significant erythema, Dressing is clean, dry, and intact, small area of dry saturation on the right lower aspect  ?DVT Evaluation: 1+ pitting edema to mid shin bilaterally without tenderness or erythema  ? ?Labs: ?Lab Results  ?Component Value Date  ? WBC 12.3 (H) 08/20/2021  ? HGB 9.7 (L) 08/20/2021  ? HCT 28.6 (L) 08/20/2021  ? MCV 84.9 08/20/2021  ? PLT 237 08/20/2021  ? ?CMP Latest Ref Rng & Units 08/19/2021  ?Glucose 70 - 99 mg/dL 73  ?BUN 6 - 20 mg/dL <5(L)  ?Creatinine 0.44 - 1.00 mg/dL 0.61  ?Sodium 135 - 145 mmol/L 135  ?Potassium 3.5 - 5.1 mmol/L 4.3  ?Chloride 98 - 111 mmol/L 107  ?CO2 22 - 32 mmol/L 19(L)  ?Calcium 8.9 -  10.3 mg/dL 8.6(L)  ?Total Protein 6.5 - 8.1 g/dL 6.6  ?Total Bilirubin 0.3 - 1.2 mg/dL 1.2  ?Alkaline Phos 38 - 126 U/L 242(H)  ?AST 15 - 41 U/L 27  ?ALT 0 - 44 U/L 15  ? ?Edinburgh Score: ?Edinburgh Postnatal Depression Scale Screening Tool 08/20/2021  ?I have been able to laugh and see the funny side of things. 0  ?I have looked forward with enjoyment to things. 0  ?I have blamed myself unnecessarily when things went wrong. 1  ?I have been anxious or worried for no good reason. 2  ?I have felt scared or panicky for no good reason. 1  ?Things have been getting on top of me. 1  ?I have been so unhappy that I have had difficulty sleeping. 0  ?I have felt sad or  miserable. 1  ?I have been so unhappy that I have been crying. 0  ?The thought of harming myself has occurred to me. 0  ?Edinburgh Postnatal Depression Scale Total 6  ? ? ? ?After visit meds:  ?Allergies as of 08/21/2021   ?No Known Allergies ?  ? ?  ?Medication List  ?  ? ?STOP taking these medications   ? ?Accu-Chek Guide test strip ?Generic drug: glucose blood ?  ?Accu-Chek Guide w/Device Kit ?  ?Accu-Chek Softclix Lancets lancets ?  ? ?  ? ?TAKE these medications   ? ?acetaminophen 500 MG tablet ?Commonly known as: TYLENOL ?Take 2 tablets (1,000 mg total) by mouth every 6 (six) hours as needed. ?  ?Ferrous Fumarate 324 (106 Fe) MG Tabs tablet ?Commonly known as: HEMOCYTE - 106 mg FE ?Take 1 tablet (106 mg of iron total) by mouth every other day. ?Start taking on: August 22, 2021 ?  ?furosemide 20 MG tablet ?Commonly known as: Lasix ?Take 1 tablet (20 mg total) by mouth daily for 5 days. ?  ?ibuprofen 600 MG tablet ?Commonly known as: ADVIL ?Take 1 tablet (600 mg total) by mouth every 6 (six) hours as needed. ?  ?oxyCODONE 5 MG immediate release tablet ?Commonly known as: Oxy IR/ROXICODONE ?Take 1 tablet (5 mg total) by mouth every 4 (four) hours as needed for moderate pain or severe pain. ?  ?Prenatal Plus Vitamin/Mineral 27-1 MG Tabs ?Take 1 tablet by mouth daily. ?  ? ?  ? ? ? ?Discharge home in stable condition ?Infant Feeding: Bottle ?Infant Disposition:home with mother ?Discharge instruction: per After Visit Summary and Postpartum booklet. ?Activity: Advance as tolerated. Pelvic rest for 6 weeks.  ?Diet: routine diet ?Future Appointments: ?Future Appointments  ?Date Time Provider [place]  ?08/27/2021  9:00 AM [facility] NURSE [facility]  ?10/01/2021  8:20 AM [facility] LAB [facility]  ?10/01/2021 10:15 AM Radene Gunning, MD [facility]  ? ?Follow up Visit: ?Message sent to [facility] by Dr. Gwenlyn Perking on 08/19/21. ? ?Please schedule this patient for a In person postpartum visit in 4-6 with the following provider: Any  provider. ?Additional Postpartum F/U: 2 hour GTT, Incision check 1 week, and BP check 1 week  ?High risk pregnancy complicated by: GDM, HTN, and breech presentation ?Delivery mode:  C-Section, Low Transverse  ?Anticipated Birth Control:   None ? ?08/21/2021 ?Patriciaann Clan, DO ? ? ? ?

## 2021-08-19 NOTE — Progress Notes (Signed)
Pt informed that the ultrasound is considered a limited OB ultrasound and is not intended to be a complete ultrasound exam.  Patient also informed that the ultrasound is not being completed with the intent of assessing for fetal or placental anomalies or any pelvic abnormalities.  Explained that the purpose of today?s ultrasound is to assess for  BPP, presentation, and AFI.  Patient acknowledges the purpose of the exam and the limitations of the study.    ? ? ?Patient reports headache yesterday that improved with rest. Denies dizziness or blurry vision ?Chase Caller RN BSN ?08/19/21 ? ?

## 2021-08-19 NOTE — Transfer of Care (Signed)
Immediate Anesthesia Transfer of Care Note ? ?Patient: Victoria Conner ? ?Procedure(s) Performed: CESAREAN SECTION ? ?Patient Location: PACU ? ?Anesthesia Type:Spinal ? ?Level of Consciousness: awake, alert  and patient cooperative ? ?Airway & Oxygen Therapy: Patient Spontanous Breathing ? ?Post-op Assessment: Report given to RN and Post -op Vital signs reviewed and stable ? ?Post vital signs: Reviewed and stable ? ?Last Vitals:  ?Vitals Value Taken Time  ?BP 119/72 08/19/21 1748  ?Temp    ?Pulse 92 08/19/21 1751  ?Resp 18 08/19/21 1751  ?SpO2 94 % 08/19/21 1751  ?Vitals shown include unvalidated device data. ? ?Last Pain:  ?Vitals:  ? 08/19/21 1609  ?TempSrc: Oral  ?   ? ?  ? ?Complications: No notable events documented. ?

## 2021-08-20 ENCOUNTER — Encounter (HOSPITAL_COMMUNITY): Payer: Self-pay | Admitting: Family Medicine

## 2021-08-20 LAB — CBC
HCT: 28.6 % — ABNORMAL LOW (ref 36.0–46.0)
Hemoglobin: 9.7 g/dL — ABNORMAL LOW (ref 12.0–15.0)
MCH: 28.8 pg (ref 26.0–34.0)
MCHC: 33.9 g/dL (ref 30.0–36.0)
MCV: 84.9 fL (ref 80.0–100.0)
Platelets: 237 10*3/uL (ref 150–400)
RBC: 3.37 MIL/uL — ABNORMAL LOW (ref 3.87–5.11)
RDW: 17.9 % — ABNORMAL HIGH (ref 11.5–15.5)
WBC: 12.3 10*3/uL — ABNORMAL HIGH (ref 4.0–10.5)
nRBC: 0 % (ref 0.0–0.2)

## 2021-08-20 LAB — GLUCOSE, CAPILLARY: Glucose-Capillary: 76 mg/dL (ref 70–99)

## 2021-08-20 MED ORDER — FERROUS FUMARATE 324 (106 FE) MG PO TABS
1.0000 | ORAL_TABLET | ORAL | Status: DC
  Administered 2021-08-20: 106 mg via ORAL
  Filled 2021-08-20: qty 1

## 2021-08-20 MED ORDER — LACTATED RINGERS IV BOLUS
700.0000 mL | Freq: Once | INTRAVENOUS | Status: AC
  Administered 2021-08-20: 700 mL via INTRAVENOUS

## 2021-08-20 NOTE — Lactation Note (Addendum)
This note was copied from a baby's chart. ?Lactation Consultation Note ? ?Patient Name: Victoria Conner ?Today's Date: 08/20/2021 ?Reason for consult: Follow-up assessment;Mother's request;Difficult latch;Early term 37-38.6wks;Infant < 6lbs;Infant weight loss;Breastfeeding assistance ?Age:25 hours ?Kiswahili translator [phone] Faddy used for visit with Mom and Dad.  ? ?Infant fed 25 ml of formula by Dad prior to start of the visit. Mom latching in cradle and we reviewed different positions including cross cradle and football to get more depth on the breast. Infant just holding nipple in her mouth for now.  ? ?Mom to call for latch assistance with next feeding. ?Mom states 24 flanges comfortable fit but aware to adjust accordingly.  ?Infant had 3 stool and 2 urine today.  ? ? ?LPTI guidelines reviewed to reduce calorie loss including keeping total feeding under 30 min.  ? ?Plan 1. To feed based on cues 8-12x 24hr period. Mom to offer breasts and look for signs of milk transfer.  ?2. Mom to supplement with EBM first followed by formula for each feeding 10-20 ml. Mom aware if infant not latching to offer more.  ?3. DEBP q 3hrs for 15 min  ? ?All questions answered at the end of the visit.  ? ?Maternal Data ?Has patient been taught Hand Expression?: Yes ? ?Feeding ?Mother's Current Feeding Choice: Breast Milk and Formula ?Nipple Type: Slow - flow ? ?LATCH Score ?  ? ?  ? ?  ? ?  ? ?  ? ?  ? ? ?Lactation Tools Discussed/Used ?Tools: Pump;Flanges ?Flange Size: 24;27 (Mom using 24 flanges states comfortable fit. Mom aware with pumping nipple size can change to adjust accordingly.) ?Breast pump type: Double-Electric Breast Pump ?Pump Education: Setup, frequency, and cleaning;Milk Storage ?Reason for Pumping: increase stimulation ?Pumping frequency: increase stimulation ? ?Interventions ?Interventions: Breast feeding basics reviewed;Assisted with latch;Skin to skin;Breast massage;Hand express;Breast compression;Adjust  position;Support pillows;Position options;Expressed milk;DEBP;Education;Pace feeding;LC Psychologist, educational;Infant Driven Feeding Algorithm education ? ?Discharge ?Pump: Manual ?WIC Program: No (Parents provided with number to get cerfication process started.) ? ?Consult Status ?Consult Status: Follow-up ?Date: 08/21/21 ?Follow-up type: In-patient ? ? ? ?Meosha Castanon  Nicholson-Springer ?08/20/2021, 4:02 PM ? ? ? ?

## 2021-08-20 NOTE — Anesthesia Postprocedure Evaluation (Signed)
Anesthesia Post Note ? ?Patient: Victoria Conner ? ?Procedure(s) Performed: CESAREAN SECTION ? ?  ? ?Patient location during evaluation: PACU ?Anesthesia Type: Spinal ?Level of consciousness: oriented and awake and alert ?Pain management: pain level controlled ?Vital Signs Assessment: post-procedure vital signs reviewed and stable ?Respiratory status: spontaneous breathing, respiratory function stable and patient connected to nasal cannula oxygen ?Cardiovascular status: blood pressure returned to baseline and stable ?Postop Assessment: no headache, no backache and no apparent nausea or vomiting ?Anesthetic complications: no ? ? ?No notable events documented. ? ?Last Vitals:  ?Vitals:  ? 08/20/21 1001 08/20/21 1434  ?BP: 111/62 114/76  ?Pulse: 85 84  ?Resp: 18 18  ?Temp: 37.1 ?C 36.5 ?C  ?SpO2: 98%   ?  ?Last Pain:  ?Vitals:  ? 08/20/21 1810  ?TempSrc:   ?PainSc: 0-No pain  ? ?Pain Goal:   ? ?  ?  ?  ?  ?  ?  ?  ? ?Victoria Conner ? ? ? ? ?

## 2021-08-20 NOTE — Progress Notes (Signed)
Subjective: ?Postpartum Day 1: Cesarean Delivery ?Patient reports tolerating PO.  Ambulating with assistance, without pain. Did not sleep much last night due to baby crying. Catheter still in place. States she has not had any flatus yet. She is planning on breastfeeding and using formula. Does not desire PP birth control.  ? ?Objective: ?Vital signs in last 24 hours: ?Temp:  [96.1 ?F (35.6 ?C)-98.8 ?F (37.1 ?C)] 98 ?F (36.7 ?C) (03/02 0500) ?Pulse Rate:  [72-97] 94 (03/02 0200) ?Resp:  [14-18] 18 (03/02 0200) ?BP: (101-148)/(64-100) 101/64 (03/02 0200) ?SpO2:  [93 %-100 %] 100 % (03/02 0500) ?Weight:  [76.7 kg] 76.7 kg (03/01 1129) ? ?Physical Exam:  ?General: alert, cooperative, appears stated age, and no distress ?Lochia: appropriate ?Uterine Fundus: firm ?Incision: dressing clean and dry ?DVT Evaluation: No evidence of DVT seen on physical exam. ? ?Recent Labs  ?  08/19/21 ?1147 08/20/21 ?0449  ?HGB 12.1 9.7*  ?HCT 35.0* 28.6*  ? ? ?Assessment/Plan: ?Status post Cesarean section. Post op day 1. Doing well postoperatively.  ?Continue current care. Monitor with ambulation, BP and fasting glucose checks. ?Would like an inpatient circumcision for baby. ?#acute blood loss anemia: Hgb 12.1>9.7. asx at rest. Order PO iron for Hgb.  ?#gHTN: mild range BP stable. Will start Lasix x5 days POD#2. ?#a1GDM: glucose checks have been stable. will check fasting glucose on POD#2.  ? ? ?Veronia Beets ?08/20/2021, 8:38 AM ? ?Patient ID: Victoria Conner, female   DOB: Nov 09, 1995, 26 y.o.   MRN: [id number] ? ?

## 2021-08-20 NOTE — Progress Notes (Signed)
Patient's urinary output in her foley catheter in 4 hours was 100 ml and concentrated. Notified Dr.Horowitz. Orders for a IVF bolus.Will monitor output. ?

## 2021-08-21 ENCOUNTER — Other Ambulatory Visit (HOSPITAL_COMMUNITY): Payer: Self-pay

## 2021-08-21 DIAGNOSIS — D509 Iron deficiency anemia, unspecified: Secondary | ICD-10-CM

## 2021-08-21 LAB — GLUCOSE, CAPILLARY: Glucose-Capillary: 79 mg/dL (ref 70–99)

## 2021-08-21 LAB — SURGICAL PATHOLOGY

## 2021-08-21 MED ORDER — IBUPROFEN 600 MG PO TABS
600.0000 mg | ORAL_TABLET | Freq: Four times a day (QID) | ORAL | 0 refills | Status: DC | PRN
  Filled 2021-08-21: qty 60, 15d supply, fill #0

## 2021-08-21 MED ORDER — OXYCODONE HCL 5 MG PO TABS
5.0000 mg | ORAL_TABLET | ORAL | 0 refills | Status: DC | PRN
  Filled 2021-08-21: qty 10, 2d supply, fill #0

## 2021-08-21 MED ORDER — ACETAMINOPHEN 500 MG PO TABS: 1000.0000 mg | ORAL_TABLET | Freq: Four times a day (QID) | ORAL | 0 refills | Status: DC | PRN

## 2021-08-21 MED ORDER — FUROSEMIDE 20 MG PO TABS
20.0000 mg | ORAL_TABLET | Freq: Every day | ORAL | 0 refills | Status: DC
  Filled 2021-08-21: qty 5, 5d supply, fill #0

## 2021-08-21 MED ORDER — FERROUS GLUCONATE 324 (38 FE) MG PO TABS
324.0000 mg | ORAL_TABLET | ORAL | 0 refills | Status: DC
  Filled 2021-08-21: qty 15, 30d supply, fill #0

## 2021-08-21 NOTE — Progress Notes (Signed)
POSTPARTUM PROGRESS NOTE ? ?Post Operative Day 2 ? ?Subjective: ? ?Victoria Conner is a 26 y.o. G1P1001 s/p pLTCS for breech with gHTN at [redacted]w[redacted]d.  No acute events overnight.  Pt denies problems with ambulating, voiding or po intake.  She denies nausea or vomiting.  Pain is well controlled.  She has not had flatus. She has not had bowel movement.  Lochia Minimal. ? ?The patient is feeling well and does not have any concerns. She wants ? ?Objective: ?Blood pressure 115/80, pulse 93, temperature 98.2 ?F (36.8 ?C), temperature source Oral, resp. rate 18, height 5\' 1"  (1.549 m), weight 76.7 kg, last menstrual period 11/29/2020, SpO2 98 %, unknown if currently breastfeeding. ? ?Vitals:  ? 08/19/21 1748 08/19/21 1815 08/19/21 1830 08/19/21 1845  ?BP: 119/72 118/84 124/90 118/84  ? 08/19/21 1914 08/19/21 2040 08/19/21 2330 08/20/21 0200  ?BP: 114/72 123/88 109/66 101/64  ? 08/20/21 1001 08/20/21 1434 08/20/21 2100 08/21/21 0530  ?BP: 111/62 114/76 (!) 110/59 115/80  ? ?Physical Exam:  ?General: alert, cooperative and no distress ?Chest: no respiratory distress, normal work of breathing on room air ?Heart: regular rate, distal pulses intact ?Abdomen: soft, nontender ?Uterine Fundus: firm, appropriately tender ?DVT Evaluation: No calf swelling or tenderness ?Extremities: Trace bilateral lower extremity edema ?Skin: warm, dry; incision clean/dry/intact ? ?Recent Labs  ?  08/19/21 ?1147 08/20/21 ?0449  ?HGB 12.1 9.7*  ?HCT 35.0* 28.6*  ? ? ?Assessment/Plan: ?Victoria Conner is a 26 y.o. G1P1001 s/p pLTCS for breech with gHTN at [redacted]w[redacted]d. ? ?POD#2 - Doing well, no new concerns ? ?Contraception: None, patient states she is not interested in getting pregnant, but will not be using contraception. She states repeatedly she will not get pregnant. Without forcing the issue, further discussion is planned. ?Feeding: Breast, no issues ?Dispo: Plan for discharge today pending first BM and flatus. ?Circumcision: Planned for today, has not been  performed yet. ? ? LOS: 2 days  ? ?Victoria Conner, [redacted]w[redacted]d, CNM ?08/21/2021, 7:17 AM   ?

## 2021-08-21 NOTE — Progress Notes (Signed)
This RN ordered pt breakfast and lunch according to preference with the help of Swahili interpreter Kirke Shaggy [phone].  ? ?Elvia Collum, RN ?08/21/21 ? ?

## 2021-08-21 NOTE — Lactation Note (Signed)
This note was copied from a baby's chart. ?Lactation Consultation Note ? ?Patient Name: Victoria Conner ?Today's Date: 08/21/2021 ?Reason for consult: Follow-up assessment;Early term 37-38.6wks ?Age:25 hours ? ?Mom states baby has been feeding well.  Infant cueing when LC entered.  LC offered assistance.  Interpreter used.   ? ?LC assisted with latching in recliner but infant was not willing to latch.   ? ?Mom has manual pump.  LC encouraged mom to pump when baby receives a bottle and after BF to stimulate milk supply.   ? ?Mom has no questions.  LC encouraged mom to see LC support at the [facility].  Mom is aware Soyla Dryer is an IBCLC at the clinic.   ? ? ? ?Maternal Data ?  ? ?Feeding ?Mother's Current Feeding Choice: Breast Milk and Formula ?Nipple Type: Slow - flow ? ?LATCH Score ?Latch: Too sleepy or reluctant, no latch achieved, no sucking elicited. ? ?Audible Swallowing: None ? ?Type of Nipple: Everted at rest and after stimulation ? ?Comfort (Breast/Nipple): Soft / non-tender ? ?Hold (Positioning): Assistance needed to correctly position infant at breast and maintain latch. ? ?LATCH Score: 5 ? ? ?Lactation Tools Discussed/Used ?  ? ?Interventions ?Interventions: Education;Hand express;Support pillows ? ?Discharge ?Pump: Manual ? ?Consult Status ?Consult Status: Complete ?Date: 08/21/21 ?Follow-up type: In-patient ? ? ? ?Maryruth Hancock Azizi Bally ?08/21/2021, 1:21 PM ? ? ? ?

## 2021-08-27 ENCOUNTER — Ambulatory Visit: Payer: Medicaid Other

## 2021-08-27 ENCOUNTER — Encounter: Payer: Medicaid Other | Admitting: Obstetrics and Gynecology

## 2021-08-27 ENCOUNTER — Telehealth: Payer: Self-pay

## 2021-08-27 NOTE — Telephone Encounter (Signed)
Called pt with [facility] interpreter ID [phone]; VM left stating I am calling to follow up on missed appt and included callback number. Called patient contact; same VM left by interpreter. ?

## 2021-08-29 ENCOUNTER — Telehealth (HOSPITAL_COMMUNITY): Payer: Self-pay | Admitting: *Deleted

## 2021-08-29 NOTE — Telephone Encounter (Signed)
Phone voicemail message left per interpreter to return nurse call. ? ?Duffy Rhody, RN 08-29-2021 at 2:25pm ?

## 2021-09-03 ENCOUNTER — Encounter: Payer: Medicaid Other | Admitting: Obstetrics and Gynecology

## 2021-09-03 ENCOUNTER — Inpatient Hospital Stay (HOSPITAL_COMMUNITY)
Admission: AD | Admit: 2021-09-03 | Discharge: 2021-09-03 | Disposition: A | Discharge status: Home / Self Care | Payer: Medicaid Other | Attending: Emergency Medicine | Admitting: Emergency Medicine

## 2021-09-03 ENCOUNTER — Inpatient Hospital Stay (HOSPITAL_BASED_OUTPATIENT_CLINIC_OR_DEPARTMENT_OTHER): Payer: Medicaid Other

## 2021-09-03 ENCOUNTER — Other Ambulatory Visit: Payer: Self-pay

## 2021-09-03 ENCOUNTER — Ambulatory Visit (INDEPENDENT_AMBULATORY_CARE_PROVIDER_SITE_OTHER): Payer: Medicaid Other | Admitting: Obstetrics & Gynecology

## 2021-09-03 VITALS — BP 133/88 | HR 102

## 2021-09-03 DIAGNOSIS — Z09 Encounter for follow-up examination after completed treatment for conditions other than malignant neoplasm: Secondary | ICD-10-CM | POA: Diagnosis not present

## 2021-09-03 DIAGNOSIS — L03119 Cellulitis of unspecified part of limb: Secondary | ICD-10-CM | POA: Insufficient documentation

## 2021-09-03 DIAGNOSIS — I10 Essential (primary) hypertension: Secondary | ICD-10-CM | POA: Insufficient documentation

## 2021-09-03 DIAGNOSIS — Z5189 Encounter for other specified aftercare: Secondary | ICD-10-CM | POA: Diagnosis not present

## 2021-09-03 DIAGNOSIS — Z0130 Encounter for examination of blood pressure without abnormal findings: Secondary | ICD-10-CM

## 2021-09-03 DIAGNOSIS — R609 Edema, unspecified: Secondary | ICD-10-CM | POA: Insufficient documentation

## 2021-09-03 DIAGNOSIS — R238 Other skin changes: Secondary | ICD-10-CM | POA: Diagnosis not present

## 2021-09-03 DIAGNOSIS — L989 Disorder of the skin and subcutaneous tissue, unspecified: Secondary | ICD-10-CM

## 2021-09-03 DIAGNOSIS — M79671 Pain in right foot: Secondary | ICD-10-CM | POA: Diagnosis not present

## 2021-09-03 DIAGNOSIS — M7989 Other specified soft tissue disorders: Secondary | ICD-10-CM

## 2021-09-03 DIAGNOSIS — S90822A Blister (nonthermal), left foot, initial encounter: Secondary | ICD-10-CM | POA: Diagnosis not present

## 2021-09-03 DIAGNOSIS — M79662 Pain in left lower leg: Secondary | ICD-10-CM

## 2021-09-03 DIAGNOSIS — M79672 Pain in left foot: Secondary | ICD-10-CM | POA: Insufficient documentation

## 2021-09-03 DIAGNOSIS — S90821A Blister (nonthermal), right foot, initial encounter: Secondary | ICD-10-CM | POA: Insufficient documentation

## 2021-09-03 DIAGNOSIS — X58XXXA Exposure to other specified factors, initial encounter: Secondary | ICD-10-CM | POA: Insufficient documentation

## 2021-09-03 LAB — COMPREHENSIVE METABOLIC PANEL
ALT: 16 U/L (ref 0–44)
AST: 17 U/L (ref 15–41)
Albumin: 3 g/dL — ABNORMAL LOW (ref 3.5–5.0)
Alkaline Phosphatase: 84 U/L (ref 38–126)
Anion gap: 12 (ref 5–15)
BUN: 7 mg/dL (ref 6–20)
CO2: 23 mmol/L (ref 22–32)
Calcium: 9.1 mg/dL (ref 8.9–10.3)
Chloride: 101 mmol/L (ref 98–111)
Creatinine, Ser: 0.62 mg/dL (ref 0.44–1.00)
GFR, Estimated: >60 mL/min (ref >60)
Glucose, Bld: 83 mg/dL (ref 70–99)
Potassium: 3.7 mmol/L (ref 3.5–5.1)
Sodium: 136 mmol/L (ref 135–145)
Total Bilirubin: 1 mg/dL (ref 0.3–1.2)
Total Protein: 7.6 g/dL (ref 6.5–8.1)

## 2021-09-03 LAB — CBC
HCT: 37 % (ref 36.0–46.0)
Hemoglobin: 11.5 g/dL — ABNORMAL LOW (ref 12.0–15.0)
MCH: 28.5 pg (ref 26.0–34.0)
MCHC: 31.1 g/dL (ref 30.0–36.0)
MCV: 91.8 fL (ref 80.0–100.0)
Platelets: 346 10*3/uL (ref 150–400)
RBC: 4.03 MIL/uL (ref 3.87–5.11)
RDW: 15.9 % — ABNORMAL HIGH (ref 11.5–15.5)
WBC: 8.9 10*3/uL (ref 4.0–10.5)
nRBC: 0 % (ref 0.0–0.2)

## 2021-09-03 LAB — LACTIC ACID, PLASMA: Lactic Acid, Venous: 1.8 mmol/L (ref 0.5–1.9)

## 2021-09-03 LAB — SEDIMENTATION RATE: Sed Rate: 105 mm/hr — ABNORMAL HIGH (ref 0–22)

## 2021-09-03 MED ORDER — VANCOMYCIN HCL 1750 MG/350ML IV SOLN
1750.0000 mg | Freq: Once | INTRAVENOUS | Status: DC
Start: 2021-09-03 — End: 2021-09-03
  Filled 2021-09-03: qty 350

## 2021-09-03 MED ORDER — BACITRACIN ZINC 500 UNIT/GM EX OINT
TOPICAL_OINTMENT | Freq: Once | CUTANEOUS | Status: AC
  Filled 2021-09-03: qty 4.5

## 2021-09-03 MED ORDER — CEPHALEXIN 500 MG PO CAPS: 500.0000 mg | ORAL_CAPSULE | Freq: Three times a day (TID) | ORAL | 0 refills | Status: AC

## 2021-09-03 MED ORDER — VANCOMYCIN HCL IN DEXTROSE 1-5 GM/200ML-% IV SOLN: 1000.0000 mg | Freq: Two times a day (BID) | INTRAVENOUS | Status: DC

## 2021-09-03 NOTE — Progress Notes (Signed)
Blood Pressure and Incision Check Visit ? ?Victoria Conner is here for blood pressure check following C-section without labor on 08/19/2021. BP today is 133/88. She does not have any BP meds. Patient denies/endorses any s/s of HTN. Reviewed with Dr Harolyn Rutherford. ? ?Pt here today for wound check. Pt had C-section on 08/19/2021.  ?Pt denies any drainage, redness or fever to site.  ? ?Blood pressure 133/88, pulse (!) 102, currently breastfeeding. ?Incision today is clean, dry, intact and well approximated.  ?Pt advised to keep clean and dry. Pt verbalized understanding.  ? ?Pt has scheduled PP visit on 10/01/2021 with PP 2 hr GTT.  ? ?Pt also has open wounds on feet/ankles that are draining since Saturday. Dr Harolyn Rutherford to evaluate. ?[place]ia [place], Victoria Conner   ? ? ?Attending Attestation ?Patient was initially assessed and managed by nursing staff during this encounter. I have reviewed the chart and agree with the documentation and plan.  ? ?Patient was met and examined, and the lesions were noted.  Patient is Swahili speaking, interpreter used for this encounter. She reports increased pain and drainage from the lesions. No new medications, contacts, or no recollections about insect bites.  No lesions anywhere else on her body, no pain anywhere else.  Cesarean incision is C/D/I.  Denies any abnormal vaginal discharge, fevers, chills, sweats, dysuria, nausea, vomiting, other GI or GU symptoms or other general symptoms. ? ?There is significant erythema and drainage, but no warmth on palpation.  See picture below. ? ? ?No fevers, vitals stable.  Unsure of etiology of these lesions, worried about possible Katherina Right Syndrome/venous stasis ulcers or other vascular or dermatologic etiology.  Discussed her condition with my partners (one of whom was in the office with me today), the consensus was for her to be sent to [facility] ER for further evaluation and this is not a common OB/postpartum condition.  This was communicated to patient and  her husband, ER staff was notified. ? ? ?I spent 30 minutes dedicated to the care of this patient including pre-visit review of records, face to face time with the patient discussing her conditions and treatments, consulting with my partners and organizing her transfer to ER. ? ? ?Victoria Schneiders, MD ?09/03/2021 12:11 PM  ? ?

## 2021-09-03 NOTE — ED Provider Notes (Signed)
?[facility] ?Provider Note ? ? ?CSN: [id number] ?Arrival date & time: 09/03/21  1208 ? ?Swahili translator used during the visit ? ?History ? ?Chief Complaint  ?Patient presents with  ? Leg Swelling  ? wound drainage  ? Leg Pain  ? ? ?Victoria Conner is a 26 y.o. female. ? ?HPI ? ?Patient presents to the ED for evaluation of leg swelling.  Patient was recently admitted to the hospital on March 1 and discharged on March 3 after a C-section.  Patient delivered at 37 weeks 4 days ?Patient presented to the doctor's office today for routine wound check follow-up.  While the patient was there she mentioned that she started having swelling and wounds on her ankles feet bilaterally since Saturday.  Patient states those areas are painful and feel cold.  She has not had any fevers or chills.  She denies any chest pain or shortness of breath.  Patient was instructed to go to the ED for further evaluation.  Patient denies having similar symptoms in the past.  She denies any new medications or lotions ?Home Medications ?Prior to Admission medications   ?Medication Sig Start Date End Date Taking? Authorizing Provider  ?acetaminophen (TYLENOL) 500 MG tablet Take 2 tablets (1,000 mg total) by mouth every 6 (six) hours as needed. 08/21/21   Allayne StackBeard, Samantha N, DO  ?ferrous gluconate (FERGON) 324 MG tablet Take 1 tablet (324 mg total) by mouth every other day. 08/22/21   Allayne StackBeard, Samantha N, DO  ?furosemide (LASIX) 20 MG tablet Take 1 tablet (20 mg total) by mouth daily for 5 days. ?Patient not taking: Reported on 09/03/2021 08/21/21 08/26/21  Allayne StackBeard, Samantha N, DO  ?ibuprofen (ADVIL) 600 MG tablet Take 1 tablet (600 mg total) by mouth every 6 (six) hours as needed. 08/21/21   Allayne StackBeard, Samantha N, DO  ?oxyCODONE (OXY IR/ROXICODONE) 5 MG immediate release tablet Take 1 tablet (5 mg total) by mouth every 4 (four) hours as needed for moderate pain or severe pain. ?Patient not taking: Reported on 09/03/2021 08/21/21    Allayne StackBeard, Samantha N, DO  ?Prenatal Vit-Fe Fumarate-FA (PRENATAL PLUS VITAMIN/MINERAL) 27-1 MG TABS Take 1 tablet by mouth daily. 07/22/21   Bernerd LimboWalker, Jamilla R, CNM  ?   ? ?Allergies    ?Patient has no known allergies.   ? ?Review of Systems   ?Review of Systems  ?Constitutional:  Negative for fever.  ? ?Physical Exam ?Updated Vital Signs ?BP (!) 137/99   Pulse 85   Temp 98.2 ?F (36.8 ?C) (Oral)   Resp 19   Ht 1.549 m (5\' 1" )   Wt 76.7 kg   LMP  (LMP Unknown)   SpO2 100%   Breastfeeding Yes Comment: baby is doing well, breast feeding doing well  BMI 31.95 kg/m?  ?Physical Exam ?Vitals and nursing note reviewed.  ?Constitutional:   ?   General: She is not in acute distress. ?   Appearance: She is well-developed.  ?HENT:  ?   Head: Normocephalic and atraumatic.  ?   Right Ear: External ear normal.  ?   Left Ear: External ear normal.  ?Eyes:  ?   General: No scleral icterus.    ?   Right eye: No discharge.     ?   Left eye: No discharge.  ?   Conjunctiva/sclera: Conjunctivae normal.  ?Neck:  ?   Trachea: No tracheal deviation.  ?Cardiovascular:  ?   Rate and Rhythm: Normal rate and regular rhythm.  ?Pulmonary:  ?  Effort: Pulmonary effort is normal. No respiratory distress.  ?   Breath sounds: Normal breath sounds. No stridor. No wheezing or rales.  ?Abdominal:  ?   General: Bowel sounds are normal. There is no distension.  ?   Palpations: Abdomen is soft.  ?   Tenderness: There is no abdominal tenderness. There is no guarding or rebound.  ?Musculoskeletal:     ?   General: No tenderness or deformity.  ?   Cervical back: Neck supple.  ?   Right lower leg: Edema present.  ?   Left lower leg: Edema present.  ?   Comments: Erythema and edema feet, ankles, lower legs.  No lymphangitic streaking.  Bullous weeping lesions, serous drainage  ?Skin: ?   General: Skin is warm and dry.  ?   Findings: Rash present.  ?   Comments: No muco cutaneous lesions noted, no rash involving torso or upper extremities  ?Neurological:  ?    General: No focal deficit present.  ?   Mental Status: She is alert.  ?   Cranial Nerves: No cranial nerve deficit (no facial droop, extraocular movements intact, no slurred speech).  ?   Sensory: No sensory deficit.  ?   Motor: No abnormal muscle tone or seizure activity.  ?   Coordination: Coordination normal.  ?Psychiatric:     ?   Mood and Affect: Mood normal.  ? ? ? ?ED Results / Procedures / Treatments   ?Labs ?(all labs ordered are listed, but only abnormal results are displayed) ?Labs Reviewed  ?CBC - Abnormal; Notable for the following components:  ?    Result Value  ? Hemoglobin 11.5 (*)   ? RDW 15.9 (*)   ? All other components within normal limits  ?SEDIMENTATION RATE - Abnormal; Notable for the following components:  ? Sed Rate 105 (*)   ? All other components within normal limits  ?COMPREHENSIVE METABOLIC PANEL - Abnormal; Notable for the following components:  ? Albumin 3.0 (*)   ? All other components within normal limits  ?CULTURE, BLOOD (ROUTINE X 2)  ?CULTURE, BLOOD (ROUTINE X 2)  ?AEROBIC CULTURE W GRAM STAIN (SUPERFICIAL SPECIMEN)  ?LACTIC ACID, PLASMA  ?LACTIC ACID, PLASMA  ? ? ?EKG ?None ? ?Radiology ?VAS [place] LOWER EXTREMITY VENOUS (DVT) ? ?Result Date: 09/03/2021 ? Lower Venous DVT Study Patient Name:  JALISHA ENNEKING  Date of Exam:   09/03/2021 Medical Rec #: [id number]        Accession #:    [id number] Date of Birth: April 01, 1996         Patient Gender: F Patient Age:   26 years Exam Location:  [facility] Procedure:      VAS [place] LOWER EXTREMITY VENOUS (DVT) Referring Phys: Takai Chiaramonte --------------------------------------------------------------------------------  Indications: Pain, Swelling, and Bilateral foot blisters.  Risk Factors: Recent cesarian. Gestational diabetes and hypertension. Limitations: Edema. Comparison Study: No prior study Performing Technologist: Sherren Kerns RVS  Examination Guidelines: A complete evaluation includes B-mode imaging, spectral Doppler, color Doppler,  and power Doppler as needed of all accessible portions of each vessel. Bilateral testing is considered an integral part of a complete examination. Limited examinations for reoccurring indications may be performed as noted. The reflux portion of the exam is performed with the patient in reverse Trendelenburg.  +---------+---------------+---------+-----------+---------------+--------------+ RIGHT    CompressibilityPhasicitySpontaneityProperties     Thrombus Aging +---------+---------------+---------+-----------+---------------+--------------+ CFV      Full  Pulsatile                                                                 waveforms                     +---------+---------------+---------+-----------+---------------+--------------+ SFJ      Full                                                             +---------+---------------+---------+-----------+---------------+--------------+ FV Prox  Full                                                             +---------+---------------+---------+-----------+---------------+--------------+ FV Mid   Full                                                             +---------+---------------+---------+-----------+---------------+--------------+ FV DistalFull                               Pulsatile                                                                 waveforms                     +---------+---------------+---------+-----------+---------------+--------------+ PFV      Full                                                             +---------+---------------+---------+-----------+---------------+--------------+ POP                                         Pulsatile                                                                 waveforms                     +---------+---------------+---------+-----------+---------------+--------------+  PTV                                                         patent by                                                                 color and                                                                 Doppler        +---------+---------------+---------+-----------+---------------+--------------+ PE

## 2021-09-03 NOTE — Progress Notes (Signed)
VASCULAR LAB ? ? ? ?Bilateral lower extremity venous duplex has been performed. ? ?See CV proc for preliminary results. ? ?Messaged results to Dr. Lynelle Doctor via secure chat. ? ?Likisha Alles, RVT ?09/03/2021, 1:45 PM ? ?

## 2021-09-03 NOTE — ED Notes (Signed)
Pt/family requesting to speak with MD prior to giving medication. MD notified  ?

## 2021-09-03 NOTE — Progress Notes (Signed)
Pharmacy Antibiotic Note ? ?Victoria Conner is a 26 y.o. female admitted on 09/03/2021 presenting with open wounds on feet and ankles with draining fluid.  Pharmacy has been consulted for vancomycin dosing. ? ?Plan: ?Vancomycin 1750 mg IV x 1, then 1000 mg IV q 12h (eAUC 504, Goal AUC 400-550, SCr used 0.8) ?Monitor renal function, Cx and clinical progression to narrow ?Vancomycin levels as indicated ? ?Height: 5\' 1"  (154.9 cm) ?Weight: 76.7 kg (169 lb 1.5 oz) ?IBW/kg (Calculated) : 47.8 ? ?Temp (24hrs), Avg:98.4 ?F (36.9 ?C), Min:98.2 ?F (36.8 ?C), Max:98.6 ?F (37 ?C) ? ?Recent Labs  ?Lab 09/03/21 ?1410  ?WBC 8.9  ?CREATININE 0.62  ?LATICACIDVEN 1.8  ?  ?Estimated Creatinine Clearance: 100.8 mL/min (by C-G formula based on SCr of 0.62 mg/dL).   ? ?No Known Allergies ? ?09/05/21, PharmD ?Clinical Pharmacist ?ED Pharmacist Phone # [phone] ?09/03/2021 4:03 PM ? ? ?

## 2021-09-03 NOTE — ED Notes (Signed)
ED Provider at bedside. 

## 2021-09-03 NOTE — MAU Provider Note (Signed)
Event Date/Time  ?First Provider Initiated Contact with Patient 09/03/21 1234   ?  ? ?S ?Ms. Victoria Conner is a 26 y.o. G1P1001 patient who presents to MAU today with complaint of leg swelling/blistering/infection. She was seen in the office today at [facility] and sent immediately to the [facility] Ed. The patient presented to MAU on accident.  ? ?Patient is Swahili speaking.  ? ?O ?BP 129/90 (BP Location: Left Arm)   Pulse 91   Temp 98.6 ?F (37 ?C) (Oral)   Resp 18   LMP  (LMP Unknown)   SpO2 100%   Breastfeeding Yes Comment: baby is doing well, breast feeding doing well ?Physical Exam ?Vitals and nursing note reviewed.  ?Neurological:  ?   Mental Status: She is alert and oriented to person, place, and time.  ? ? ?A ?Medical screening exam complete ? ?P ? ?Spoke to Dr. Lynelle Doctor. ?Transfer to [facility] ED.  ? ? ?Duane Lope, NP ?09/03/2021 12:46 PM   ?

## 2021-09-03 NOTE — ED Provider Notes (Signed)
Patient handed off to me awaiting admission.  But was called to the room as patient wanted to leave.  Concern for likely infectious process in her lower legs or possibly other dermatologic process such as bullous pemphigoid.  Patient has no fever or white count.  She did develop these lesions and bulla and redness to bilateral lower extremities here over the last several days.  She gave birth to newborn about 2 weeks ago.  She denies any shortness of breath, chest pain.  Overall she appears well.  Plan was for her to get IV vancomycin to be admitted to medicine.  After extensive conversation with them about risks and benefits of staying patient and family would like to be discharged AGAINST MEDICAL ADVICE.  Talk to them about how this could be infectious but this could be inflammatory.  They did not want a dose IV antibiotics.  Overall patient was prescribed oral antibiotics, given information to follow-up with dermatology.  Recommended close follow-up with primary care doctor.  And told to return if symptoms worsen. ? ?This chart was dictated using voice recognition software.  Despite best efforts to proofread,  errors can occur which can change the documentation meaning.  ?  Virgina Norfolk, DO ?09/03/21 1740 ? ?

## 2021-09-03 NOTE — Discharge Instructions (Signed)
Follow-up with dermatologist, call for an appointment tomorrow.  Follow-up with your primary care doctor tomorrow as well if possible.  Take oral antibiotic as prescribed.  Recommend Neosporin or bacitracin ointment twice daily to the wounds as well and lightly cover them.  As stated if symptoms are getting worse please return for evaluation. ?

## 2021-09-03 NOTE — ED Triage Notes (Signed)
Pt speaks swahili.interpretor used. Pt reports BLE calf/foot swelling x 1 week. Pt 2 wks postpartum. BLE weeping with blisters. Denies cp/sob.  ?

## 2021-09-03 NOTE — MAU Note (Signed)
Victoria Conner is a 26 y.o. PP. here in MAU reporting: c/s on 3/1. Had f/u appt today.  "Her legs are bothering her", started swelling  2 days after she left hospital. "Water started coming out on Saturday."  Throbbing pain. ?Incision from c/s is healing well. Baby is doing well. Min vag bleeding.  ?Ankles and feet are red and swollen,  2.5cm open area on rt ankle, 4cm open area on left inner ankle.  ?Onset of complaint: 2 days after d/c, started swelling ?Pain score: 4/10 ?Vitals:  ? 09/03/21 1229  ?BP: 129/90  ?Pulse: 91  ?Resp: 18  ?Temp: 98.6 ?F (37 ?C)  ?SpO2: 100%  ?   ? ?Lab orders placed from triage:  none ?

## 2021-09-03 NOTE — ED Notes (Signed)
Pts blisteres cleaned with saline, 4x4 used to dry, bacitracin placed, then telfa and wrapped with kerlix. Pt understood dressing, some supplies given to pt.  ?

## 2021-09-11 LAB — AEROBIC CULTURE W GRAM STAIN (SUPERFICIAL SPECIMEN)

## 2021-09-12 ENCOUNTER — Telehealth: Payer: Self-pay | Admitting: Emergency Medicine

## 2021-09-12 NOTE — Telephone Encounter (Signed)
Post ED Visit - Positive Culture Follow-up: Unsuccessful Patient Follow-up ? ?Culture assessed and recommendations reviewed by: ? ?[]  , Pharm.D. ?[]  Enzo Bi, Pharm.D., BCPS AQ-ID ?[]  , Pharm.D., BCPS ?[]  Celedonio Miyamoto, Pharm.D., BCPS ?[]  [place], Garvin Fila.D., BCPS, AAHIVP ?[]  , Pharm.D., BCPS, AAHIVP ?[x]  Georgina Pillion, PharmD ?[]  , PharmD, BCPS ? ?Positive aerobic culture ? ?[]  Patient discharged without antimicrobial prescription and treatment is now indicated ?[x]  Organism is resistant to prescribed ED discharge antimicrobial ?[]  Patient with positive blood cultures ? ? ?Unable to contact patient by phone using Swahili language interpreter, lleft voicemail to return call. Letter will be sent to address on file ? ?[name] ?09/12/2021, 9:42 PM  ?

## 2021-09-12 NOTE — Progress Notes (Signed)
ED Antimicrobial Stewardship Positive Culture Follow Up  ? ?Victoria Conner is an 26 y.o. female who presented to [facility] on 09/03/2021 with a chief complaint of  ?Chief Complaint  ?Patient presents with  ? Leg Swelling  ? wound drainage  ? Leg Pain  ? ? ?Recent Results (from the past 720 hour(s))  ?Resp Panel by RT-PCR (Flu A&B, Covid) Nasopharyngeal Swab     Status: None  ? Collection Time: 08/19/21 11:29 AM  ? Specimen: Nasopharyngeal Swab; Nasopharyngeal(NP) swabs in vial transport medium  ?Result Value Ref Range Status  ? SARS Coronavirus 2 by RT PCR NEGATIVE NEGATIVE Final  ?  Comment: (NOTE) ?SARS-CoV-2 target nucleic acids are NOT DETECTED. ? ?The SARS-CoV-2 RNA is generally detectable in upper respiratory ?specimens during the acute phase of infection. The lowest ?concentration of SARS-CoV-2 viral copies this assay can detect is ?138 copies/mL. A negative result does not preclude SARS-Cov-2 ?infection and should not be used as the sole basis for treatment or ?other patient management decisions. A negative result may occur with  ?improper specimen collection/handling, submission of specimen other ?than nasopharyngeal swab, presence of viral mutation(s) within the ?areas targeted by this assay, and inadequate number of viral ?copies(<138 copies/mL). A negative result must be combined with ?clinical observations, patient history, and epidemiological ?information. The expected result is Negative. ? ?Fact Sheet for Patients:  ?BloggerCourse.com ? ?Fact Sheet for Healthcare Providers:  ?SeriousBroker.it ? ?This test is no t yet approved or cleared by the [place] FDA and  ?has been authorized for detection and/or diagnosis of SARS-CoV-2 by ?FDA under an Emergency Use Authorization (EUA). This EUA will remain  ?in effect (meaning this test can be used) for the duration of the ?COVID-19 declaration under Section 564(b)(1) of the Act, 21 ?U.S.C.section  360bbb-3(b)(1), unless the authorization is terminated  ?or revoked sooner.  ? ? ?  ? Influenza A by PCR NEGATIVE NEGATIVE Final  ? Influenza B by PCR NEGATIVE NEGATIVE Final  ?  Comment: (NOTE) ?The Xpert Xpress SARS-CoV-2/FLU/RSV plus assay is intended as an aid ?in the diagnosis of influenza from Nasopharyngeal swab specimens and ?should not be used as a sole basis for treatment. Nasal washings and ?aspirates are unacceptable for Xpert Xpress SARS-CoV-2/FLU/RSV ?testing. ? ?Fact Sheet for Patients: ?BloggerCourse.com ? ?Fact Sheet for Healthcare Providers: ?SeriousBroker.it ? ?This test is not yet approved or cleared by the [place] FDA and ?has been authorized for detection and/or diagnosis of SARS-CoV-2 by ?FDA under an Emergency Use Authorization (EUA). This EUA will remain ?in effect (meaning this test can be used) for the duration of the ?COVID-19 declaration under Section 564(b)(1) of the Act, 21 U.S.C. ?section 360bbb-3(b)(1), unless the authorization is terminated or ?revoked. ? ?Performed at [facility], [address] [place], [place] ?[address] ?  ?Group B strep by PCR     Status: None  ? Collection Time: 08/19/21  1:55 PM  ? Specimen: Vaginal/Rectal; Genital  ?Result Value Ref Range Status  ? Group B strep by PCR NEGATIVE NEGATIVE Final  ?  Comment: (NOTE) ?Intrapartum testing with Xpert GBS assay should be used as an adjunct ?to other methods available and not used to replace antepartum testing ?(at 35-[redacted] weeks gestation). ?Performed at [facility], [address] [place], [place] ?[address] ?  ?Aerobic Culture w Gram Stain (superficial specimen)     Status: None  ? Collection Time: 09/03/21  2:00 PM  ? Specimen: Leg; Wound  ?Result Value Ref  Range Status  ? Specimen Description LEG  Final  ? Special Requests WD  Final  ? Gram Stain   Final  ?  FEW GRAM POSITIVE COCCI IN PAIRS ?RARE WBC PRESENT, PREDOMINANTLY MONONUCLEAR ?Performed  at [facility], [address] [place], [place] [address] ?  ? Culture   Final  ?  MODERATE ENTEROBACTER CLOACAE ?FEW ENTEROCOCCUS FAECALIS ?  ? Report Status 09/11/2021 FINAL  Final  ? Organism ID, Bacteria ENTEROBACTER CLOACAE  Final  ? Organism ID, Bacteria ENTEROCOCCUS FAECALIS  Final  ?    Susceptibility  ? Enterobacter cloacae - MIC*  ?  CEFAZOLIN >=64 RESISTANT Resistant   ?  CEFEPIME <=0.12 SENSITIVE Sensitive   ?  CEFTAZIDIME <=1 SENSITIVE Sensitive   ?  CIPROFLOXACIN <=0.25 SENSITIVE Sensitive   ?  GENTAMICIN <=1 SENSITIVE Sensitive   ?  IMIPENEM <=0.25 SENSITIVE Sensitive   ?  TRIMETH/SULFA <=20 SENSITIVE Sensitive   ?  PIP/TAZO <=4 SENSITIVE Sensitive   ?  * MODERATE ENTEROBACTER CLOACAE  ? Enterococcus faecalis - MIC*  ?  AMPICILLIN <=2 SENSITIVE Sensitive   ?  VANCOMYCIN 1 SENSITIVE Sensitive   ?  GENTAMICIN SYNERGY SENSITIVE Sensitive   ?  * FEW ENTEROCOCCUS FAECALIS  ? ? ?[x]  Chart reviewed with EDP Horton. Patient's presentation at time of ED visit is extremely concerning for severe illness/infection. Patient left AMA at that time and it is unknown if patient followed up with outside health care systems. Additionally, the abx prescribed at that time are unfortunately not going to cover the bacteria on culture. ? ?EDP Horton recommends patient return immediately to ED for evaluation for potentially life threatening illness. ? ?If patient refuses to go back to a hospital, we will at least need to prescribe different antibiotics. Given c-section < 30 days ago, abx choice will be dependent on breastfeeding status and willingness to pump & dump. ? ?If not breastfeeding, or breastfeeding and willing to pump and dump (this is the preferred regimen): ?Stop cephalexin ?Start: ?Cefdinir 300 mg BID for 10 days (Qty 20; Refills 0) ?PLUS ?Bactrim DS take 1 tablet BID for 10 days (Qty 20; Refills 0) ? ?If breastfeeding and unwilling to pump & dump: ?Stop cephalexin ?Start: ?Cefdinir 300 mg BID for 10 days  (Qty 20; Refills 0) ?PLUS ?Augmentin 875 mg every 12 hour for 10 days (Qty 20; Refills 0) ? ?ED Provider: Horton ? ? , PharmD, BCPS ?09/12/2021 12:20 PM ?ED Clinical Pharmacist -  [phone] ? ? ?

## 2021-09-21 ENCOUNTER — Other Ambulatory Visit: Payer: Self-pay

## 2021-09-21 DIAGNOSIS — O24410 Gestational diabetes mellitus in pregnancy, diet controlled: Secondary | ICD-10-CM

## 2021-09-30 ENCOUNTER — Ambulatory Visit: Payer: Medicaid Other | Admitting: Obstetrics and Gynecology

## 2021-10-01 ENCOUNTER — Ambulatory Visit: Payer: Medicaid Other | Admitting: Obstetrics and Gynecology

## 2021-10-01 ENCOUNTER — Other Ambulatory Visit: Payer: Medicaid Other

## 2021-10-22 ENCOUNTER — Other Ambulatory Visit: Payer: Medicaid Other

## 2021-10-22 ENCOUNTER — Ambulatory Visit: Payer: Medicaid Other | Admitting: Obstetrics and Gynecology

## 2021-12-23 ENCOUNTER — Ambulatory Visit: Payer: Medicaid Other | Admitting: Family Medicine

## 2022-01-07 ENCOUNTER — Ambulatory Visit (INDEPENDENT_AMBULATORY_CARE_PROVIDER_SITE_OTHER): Payer: Medicaid Other | Admitting: Family Medicine

## 2022-01-07 ENCOUNTER — Encounter: Payer: Self-pay | Admitting: Family Medicine

## 2022-01-07 VITALS — BP 128/92 | HR 87 | Wt 199.0 lb

## 2022-01-07 DIAGNOSIS — Z0000 Encounter for general adult medical examination without abnormal findings: Secondary | ICD-10-CM

## 2022-01-07 DIAGNOSIS — Z3009 Encounter for other general counseling and advice on contraception: Secondary | ICD-10-CM

## 2022-01-07 DIAGNOSIS — Z789 Other specified health status: Secondary | ICD-10-CM | POA: Diagnosis not present

## 2022-01-07 LAB — HEMOGLOBIN A1C
Est. average glucose Bld gHb Est-mCnc: 123 mg/dL
Hgb A1c MFr Bld: 5.9 % — ABNORMAL HIGH (ref 4.8–5.6)

## 2022-01-07 MED ORDER — BACITRACIN 500 UNIT/GM EX OINT: 1.0000 | TOPICAL_OINTMENT | Freq: Two times a day (BID) | CUTANEOUS | 0 refills | Status: DC

## 2022-01-07 NOTE — Patient Instructions (Addendum)
[facility] Address: [address] [place], [place], [place] [address] Phone: [phone]  Appt 02/12/22 at 9:20 AM

## 2022-01-07 NOTE — Progress Notes (Signed)
New patient appointment scheduled at the [facility] for 02/12/22 at 9:20 AM for patient to establish a PCP. Patient aware.

## 2022-01-07 NOTE — Progress Notes (Signed)
ANNUAL EXAM Patient name: Victoria Conner MRN [id number]  Date of birth: [birth year] Chief Complaint:   Gynecologic Exam In person Swahili interpreter present during entire visit  History of Present Illness:   Victoria Conner is a 26 y.o. G16P1001 female being seen today for a routine annual exam. Patient missed her postpartum appointment Current complaints: none   Patient's last menstrual period was 12/19/2021 (exact date). Breastfeeding   The pregnancy intention screening data noted above was reviewed. Potential methods of contraception were discussed. The patient declined birth control method.  Last pap 05/2021. Results were: NILM w/ HRHPV negative. H/O abnormal pap: no Last mammogram: none. Results were: N/A. Family h/o breast cancer: no Last colonoscopy: none. Results were: N/A. Family h/o colorectal cancer: no     07/29/2021    9:05 AM  Depression screen PHQ 2/9  Decreased Interest 0  Down, Depressed, Hopeless 0  PHQ - 2 Score 0  Altered sleeping 0  Tired, decreased energy 0  Change in appetite 0  Feeling bad or failure about yourself  0  Trouble concentrating 0  Moving slowly or fidgety/restless 0  Suicidal thoughts 0  PHQ-9 Score 0        07/29/2021    9:05 AM  GAD 7 : Generalized Anxiety Score  Nervous, Anxious, on Edge 0  Control/stop worrying 0  Worry too much - different things 0  Trouble relaxing 0  Restless 0  Easily annoyed or irritable 0  Afraid - awful might happen 0  Total GAD 7 Score 0     Review of Systems:   Pertinent items are noted in HPI Denies any headaches, blurred vision, fatigue, shortness of breath, chest pain, abdominal pain, abnormal vaginal discharge/itching/odor/irritation, problems with periods, bowel movements, urination, or intercourse unless otherwise stated above. Pertinent History Reviewed:  Reviewed past medical,surgical, social and family history.  Reviewed problem list, medications and allergies. Physical Assessment:    Vitals:   01/07/22 0843  BP: (!) 128/92  Pulse: 87  Weight: 199 lb (90.3 kg)  Body mass index is 37.6 kg/m.        Physical Examination:   General appearance - well appearing, and in no distress  Mental status - alert, oriented to person, place, and time  Psych:  She has a normal mood and affect  Chest - effort normal  Heart - normal rate  Abdomen - soft, nontender. Incision from CS examined and found to be healed on top edge and no more opening noted. Inferior to right side of bottom edge wound noted, no pus, serous drainage with raw circumscribed area, no TTP (patient did not know she had lesion and does not feel it). No induration.      Chaperone present for exam  No results found for this or any previous visit (from the past 24 hour(s)).  Assessment & Plan:  1) Well-Woman Exam Overall doing well. No concerns today. Up to date with PAP.   2) Abdominal lesion See photo above. Unclear etiology. Appears raw with serous fluid.  Inferior to right side of bottom edge wound noted, no pus, serous drainage with raw circumscribed area, no TTP (patient did not know she had lesion and does not feel it). No induration. Discussed with patient regarding concerning lesion. Of note, patient previously with bullous leg lesions (that were much worse than current lesion) that she was in ED to be evaluated and left against medical advice before getting recommended IV antibiotics. Legs now appear healed with previous areas of  lesion with hypopigmentation but no discharge and no evidence of skin breakdown.  - bacitracin ointment sent to pharmacy and dressing placed on lesion - Discussed in detail regarding ED precautions - Referred to primary care and appointment made for patient for further follow up of lesion.  3) Contraception Declines. We discussed possibility of getting pregnant even while breastfeeding. Patient expresses understanding and continues to decline contraception.   Labs/procedures  today: A1c   Mammogram: @ 25yo, or sooner if problems Colonoscopy: @ 25yo, or sooner if problems  Orders Placed This Encounter  Procedures   HgB A1c   Ambulatory Referral to Primary Care    Meds:  Meds ordered this encounter  Medications   bacitracin 500 UNIT/GM ointment    Sig: Apply 1 Application topically 2 (two) times daily.    Dispense:  15 g    Refill:  0    Follow-up: No follow-ups on file.  Warner Mccreedy, MD, MPH OB Fellow, Faculty Practice

## 2022-02-12 ENCOUNTER — Ambulatory Visit: Payer: Medicaid Other | Admitting: Nurse Practitioner

## 2023-02-20 IMAGING — US US MFM OB FOLLOW-UP
1 series · 13 of 28 positions shown · non-contrast
Comparison: none

[Series 1: us mfm ob follow-up · 72 acquisitions, 13 frames shown]
[im 3/72]
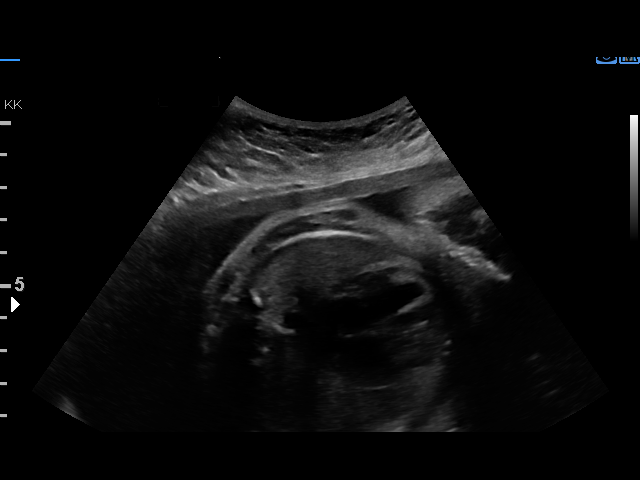
[im 8/72]
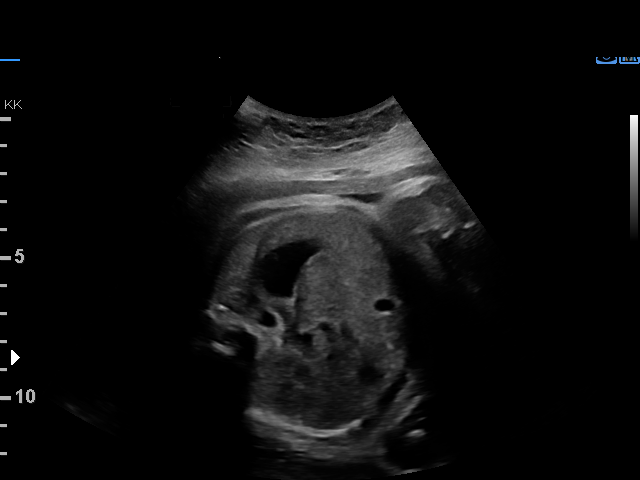
[im 14/72]
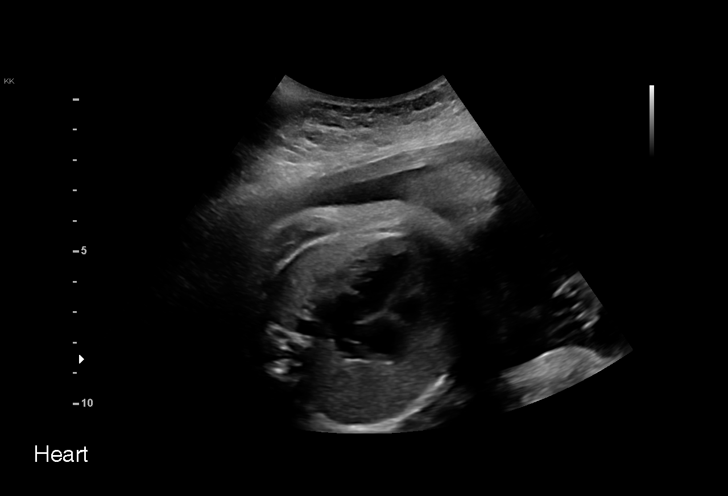
[im 19/72]
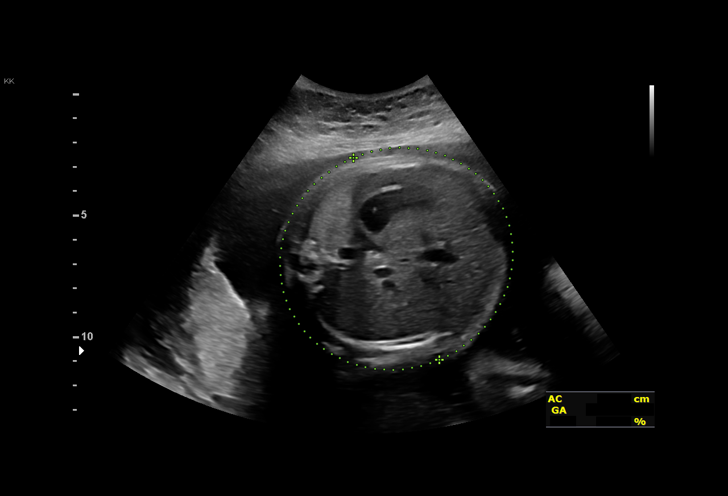
[im 24/72]
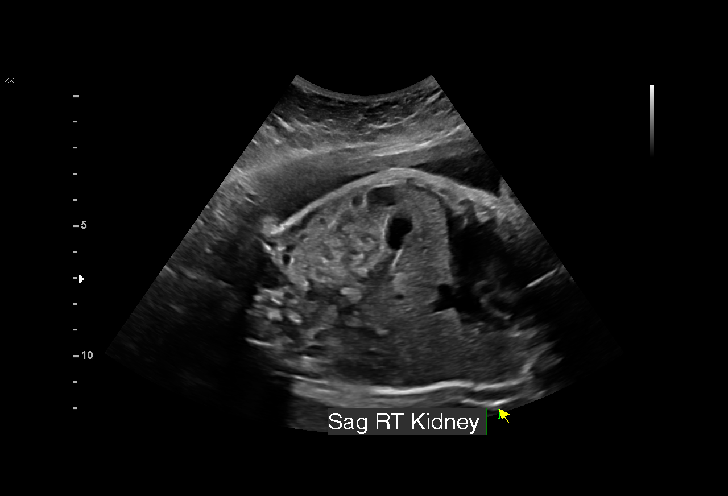
[im 29/72]
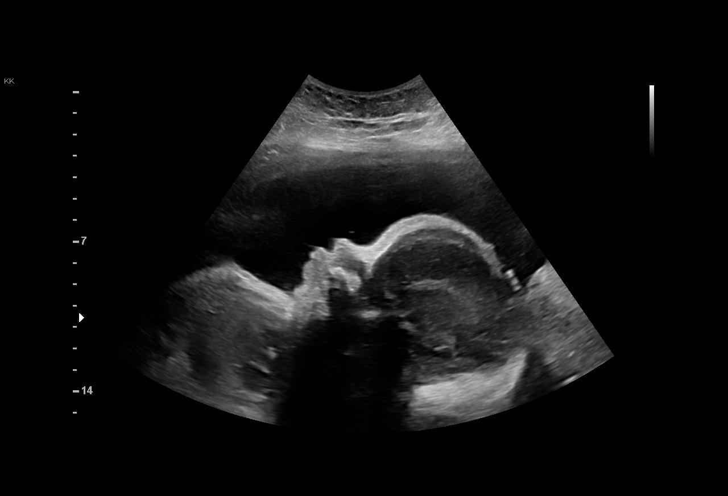
[im 37/72]
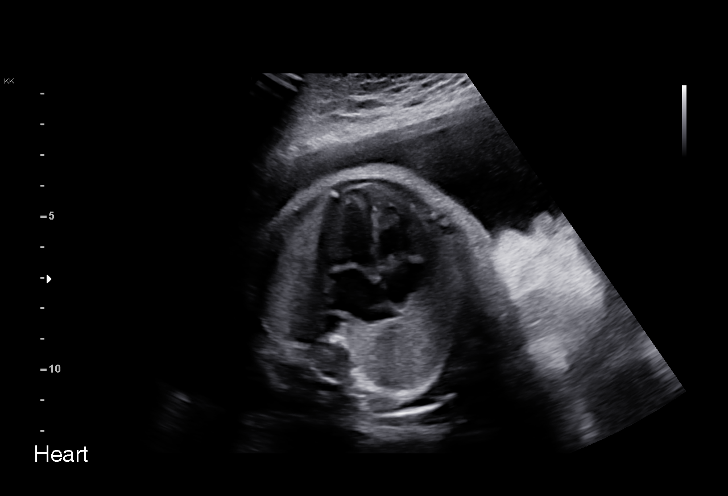
[im 43/72]
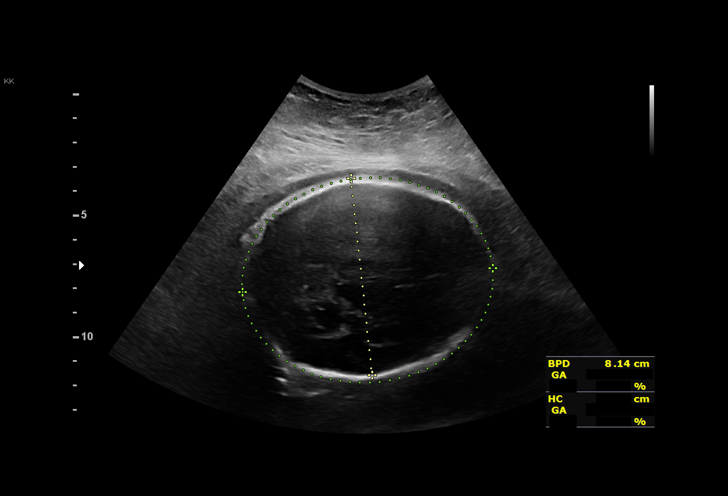
[im 48/72]
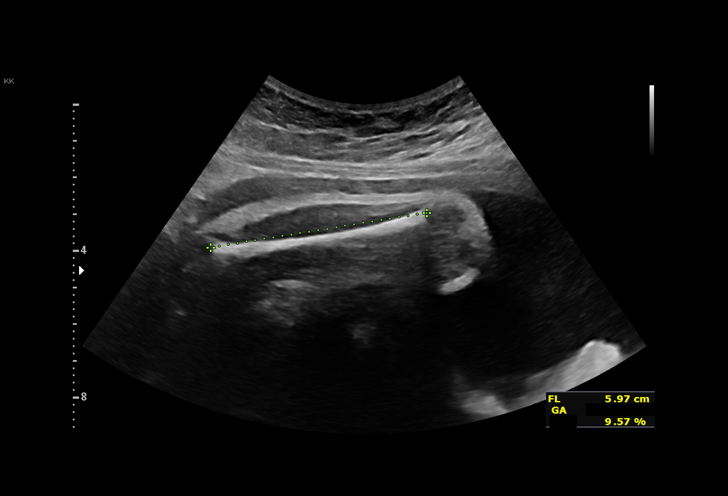
[im 53/72]
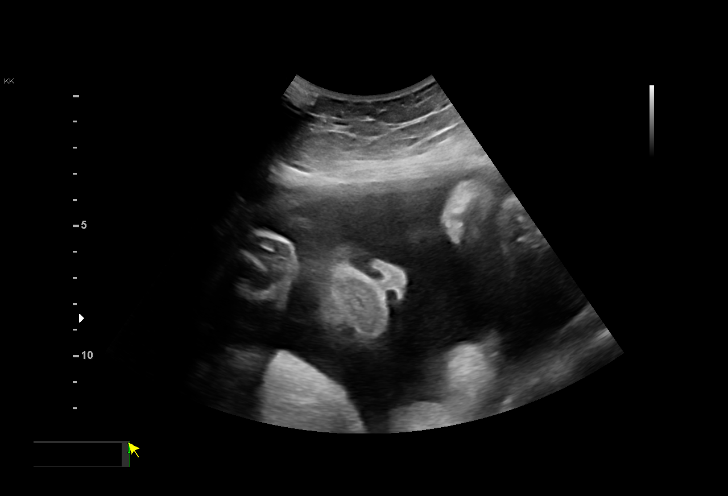
[im 58/72]
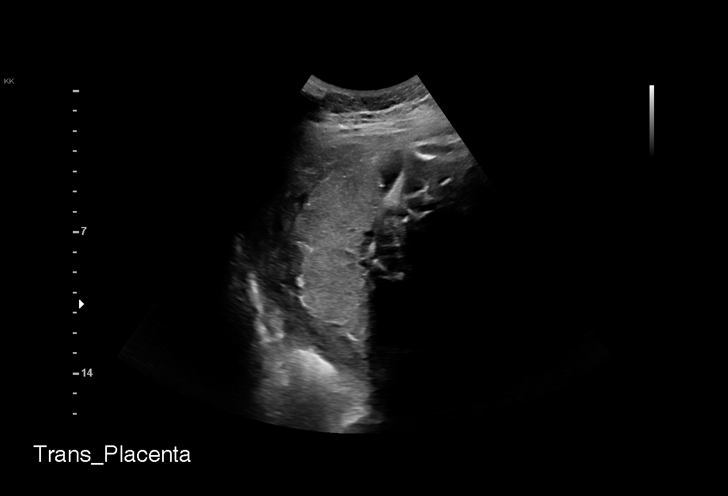
[im 64/72]
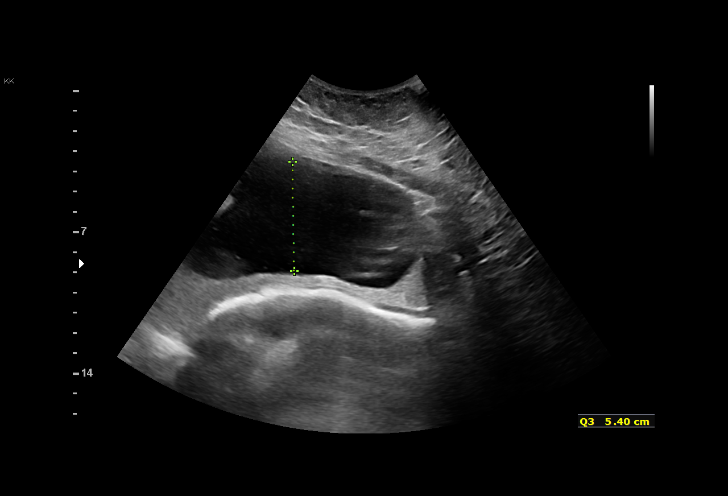
[im 69/72]
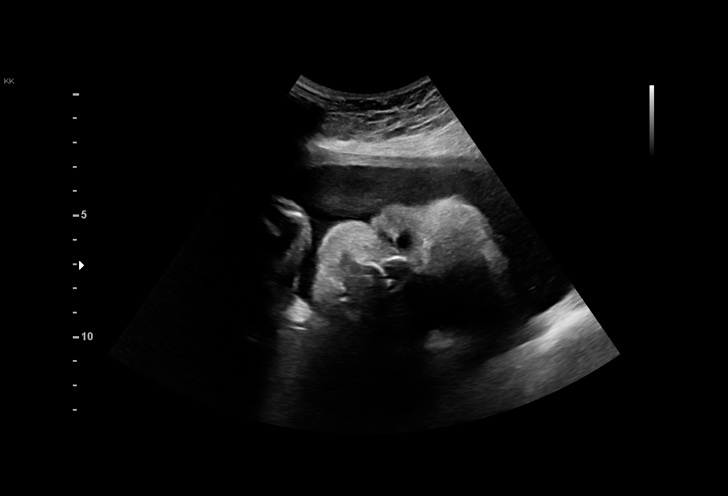

[13 of 28 positions shown; findings below may reference images not displayed]

Indications

 Gestational diabetes in pregnancy, diet
 controlled
 32 weeks gestation of pregnancy
 Encounter for other antenatal screening
 follow-up
 Late to prenatal care, third trimester
 Low Risk NIPS(Negative AFP)
 Genetic carrier (Increased Carrier Risk SMA)
 Uterine size-date discrepancy, third trimester
 Anemia during pregnancy in third trimester
Fetal Evaluation

 Num Of Fetuses:         1
 Fetal Heart Rate(bpm):  153
 Cardiac Activity:       Observed
 Presentation:           Cephalic
 Placenta:               Posterior
 P. Cord Insertion:      Previously Visualized

 Amniotic Fluid
 AFI FV:      Within normal limits

 AFI Sum(cm)     %Tile       Largest Pocket(cm)
 23.             89
 RUQ(cm)       RLQ(cm)       LUQ(cm)        LLQ(cm)

Biophysical Evaluation

 Amniotic F.V:   Pocket => 2 cm             F. Tone:        Observed
 F. Movement:    Observed                   Score:          [DATE]
 F. Breathing:   Observed
Biometry

 BPD:      81.7  mm     G. Age:  32w 6d         55  %    CI:        75.62   %    70 - 86
                                                         FL/HC:      20.4   %    19.1 -
 HC:      297.9  mm     G. Age:  33w 0d         26  %    HC/AC:      1.01        0.96 -
 AC:      296.3  mm     G. Age:  33w 4d         82  %    FL/BPD:     74.3   %    71 - 87
 FL:       60.7  mm     G. Age:  31w 4d         17  %    FL/AC:      20.5   %    20 - 24

 Est. FW:    1423  gm      4 lb 9 oz     54  %
OB History

 Gravidity:    1
Gestational Age

 LMP:           32w 3d        Date:  11/29/20                 EDD:   09/05/21
 U/S Today:     32w 5d                                        EDD:   09/03/21
 Best:          32w 3d     Det. By:  LMP  (11/29/20)          EDD:   09/05/21
Anatomy

 Cranium:               Appears normal         LVOT:                   Appears normal
 Cavum:                 Appears normal         Aortic Arch:            Previously seen
 Ventricles:            Appears normal         Ductal Arch:            Previously seen
 Choroid Plexus:        Previously seen        Diaphragm:              Appears normal
 Cerebellum:            Appears normal         Stomach:                Appears normal, left
                                                                       sided
 Posterior Fossa:       Previously seen        Abdomen:                Appears normal
 Nuchal Fold:           Previously seen        Abdominal Wall:         Previously seen
 Face:                  Orbits and profile     Cord Vessels:           Previously seen
                        previously seen
 Lips:                  Appears normal         Kidneys:                Appear normal
 Palate:                Not well visualized    Bladder:                Appears normal
 Thoracic:              Appears normal         Spine:                  Previously seen
 Heart:                 Appears normal         Upper Extremities:      Visualized; limited
                        (4CH, axis, and                                prev
                        situs)
 RVOT:                  Appears normal         Lower Extremities:      Previously seen

 Other:  Fetus appears to be a male. Heels/feet, nasal bone, lenses, VC, 3VV
         and 3VTV previously visualized. Technically difficult due to advanced
         GA and fetal position.
Cervix Uterus Adnexa

 Cervix
 Not visualized (advanced GA >40wks)
Impression

 Fetal growth is appropriate for gestational age.  Amniotic fluid
 is normal and good fetal activity seen.  Antenatal testing is
 reassuring.  BPP [DATE].
 xxxxxxxxxxxxxxxxxxxxxxxxxxxxxxxxxxxxxxxxxxxxxxxxxxxxx
 Consultation (see [REDACTED] )

 [facility] the [facility] She is G1 P0 at 32w 3d gestation
 with a new diagnosis of gestational diabetes (GDM). Patient
 has anemia and received iron infusions.

 I counseled the patient with help of Swahili language
 interpreter present in the room.
 Her blood pressure today at her office is 116/78 mmHg.

 Gestational diabetes
 I explained the diagnosis of gestational diabetes.  Patient is
 not yet aware of the diagnosis. I discussed blood glucose
 monitoring and the patient has an appointment to meet with
 our diabetic educator on 07/16/21 to discuss GDM.
 I emphasized the importance of good blood glucose control
 to prevent adverse fetal or neonatal outcomes.  I discussed
 blood glucose normal values. I encouraged her to check her
 blood glucose regularly.
 Possible complications of gestational diabetes include fetal
 macrosomia, shoulder dystocia and birth injuries, stillbirth (in
 poorly controlled diabetes) and neonatal respiratory
 syndrome and other complications.

 In about 85% of cases, gestational diabetes is well controlled
 by diet alone.  Exercise reduces the need for insulin.  Medical
 treatment includes oral hypoglycemics or insulin.
 Since her control of diabetes is not established, I recommend
 weekly BPP till delivery.

 Timing of delivery: In well-controlled diabetes on diet, patient
 can be delivered at 39- or 40-weeks' gestation. Vaginal
 delivery is not contraindicated.
 Type 2 diabetes develops in about 25% to 40% of women
 with GDM. I recommend postpartum screening with 75-g
 glucose load at 6 to 12 weeks after delivery.

Recommendations

 -Diabetic education on 07/16/21.
 -Appointments were made for weekly BPP.
                 Lyn, Niels

## 2023-03-07 IMAGING — US US FETAL BPP W/ NON-STRESS
1 series · 13 of 13 positions shown · non-contrast
Comparison: none

[Series 1: us fetal bpp w/ non-stress · 13 acquisitions, 13 frames shown]
[im 1/13]
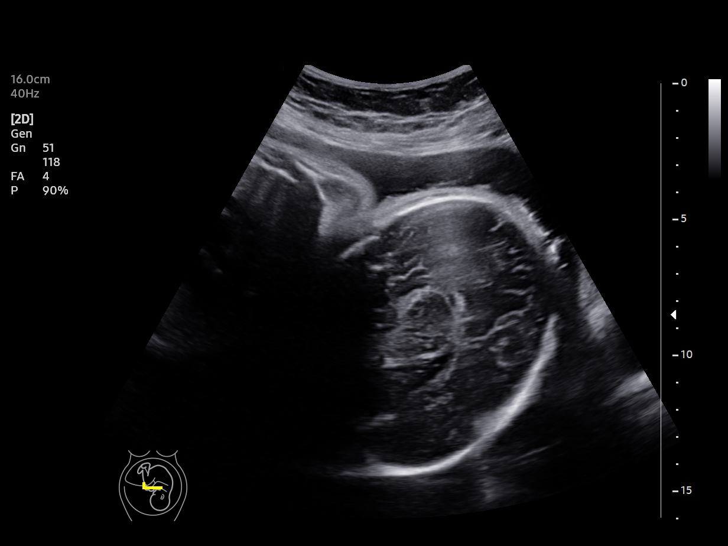
[im 2/13]
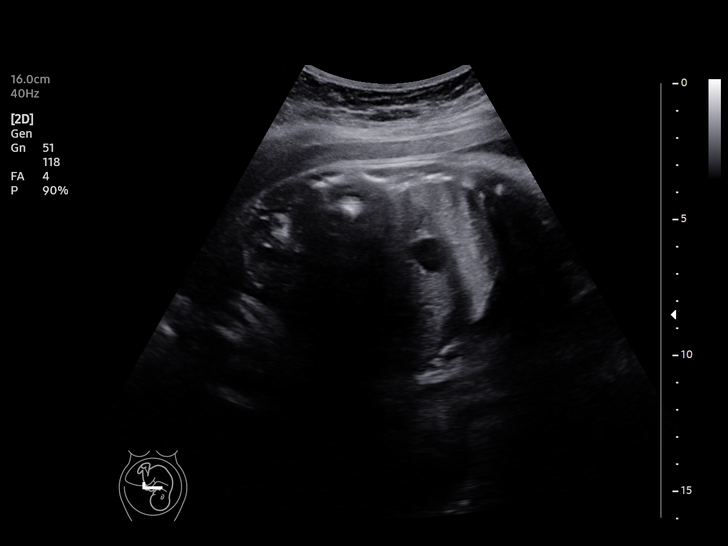
[im 3/13]
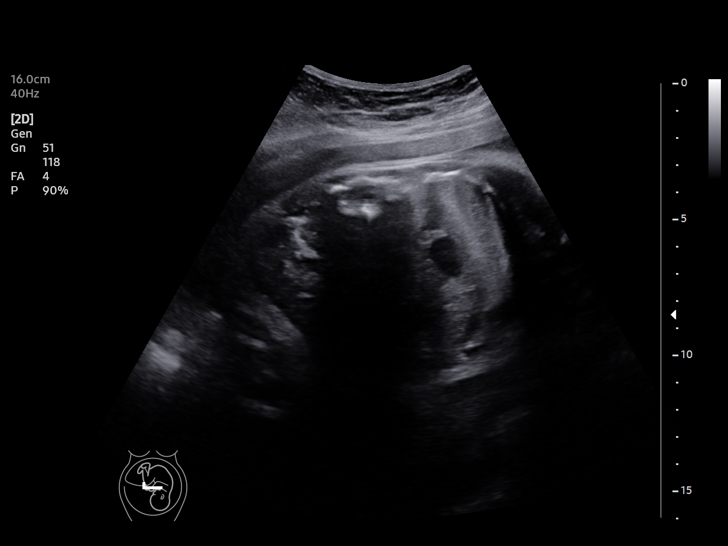
[im 4/13]
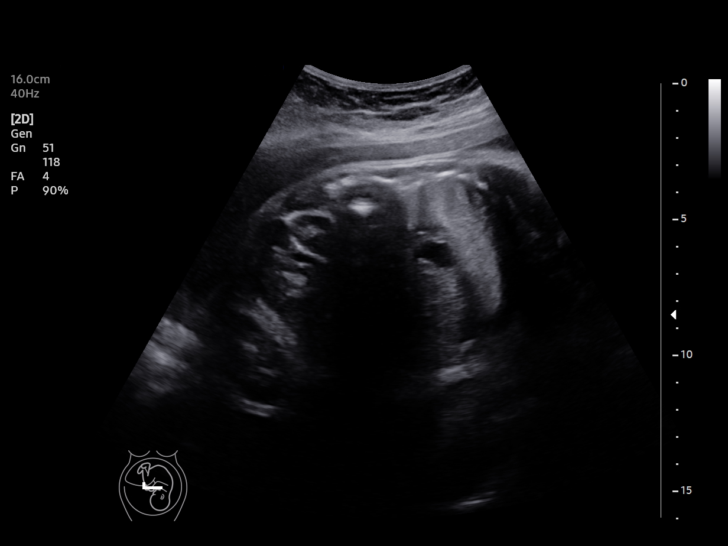
[im 5/13]
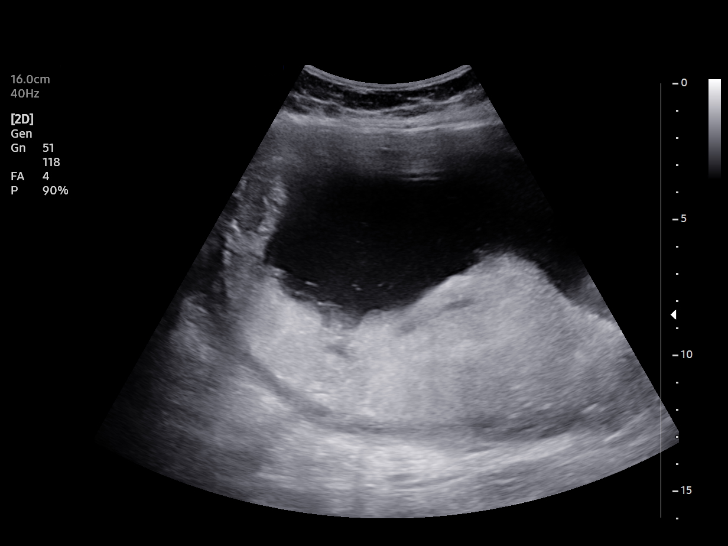
[im 6/13]
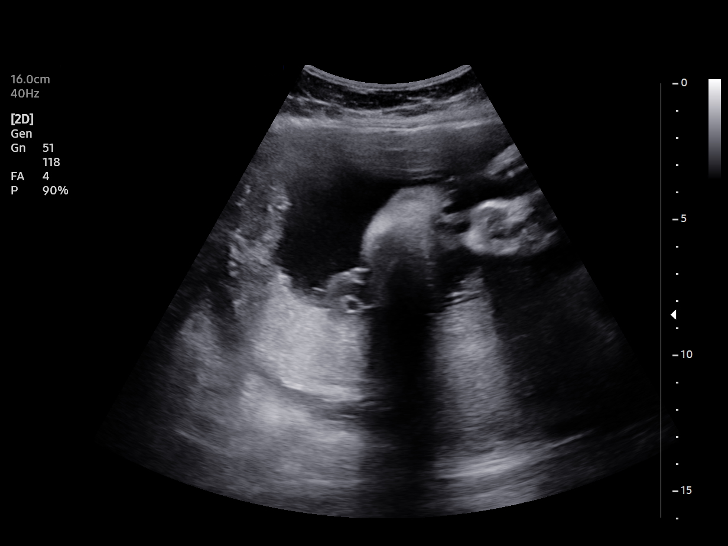
[im 7/13]
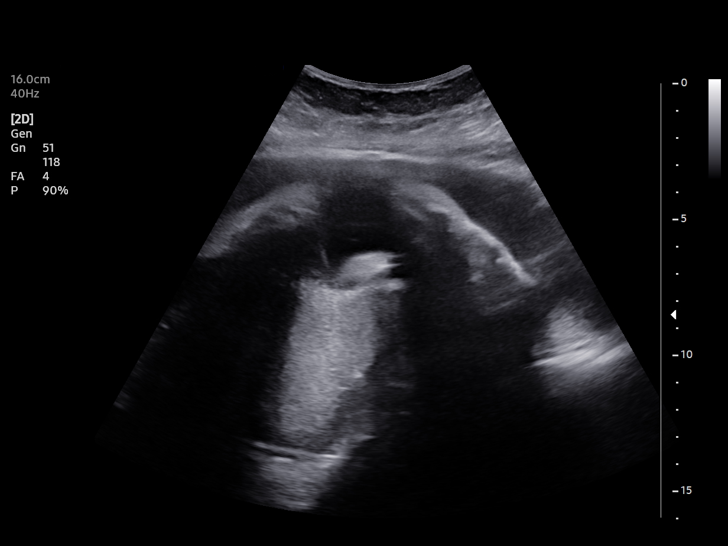
[im 8/13]
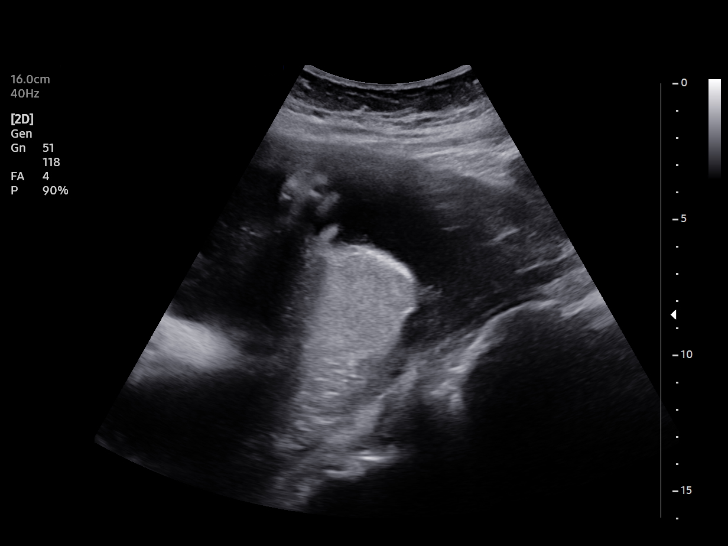
[im 9/13]
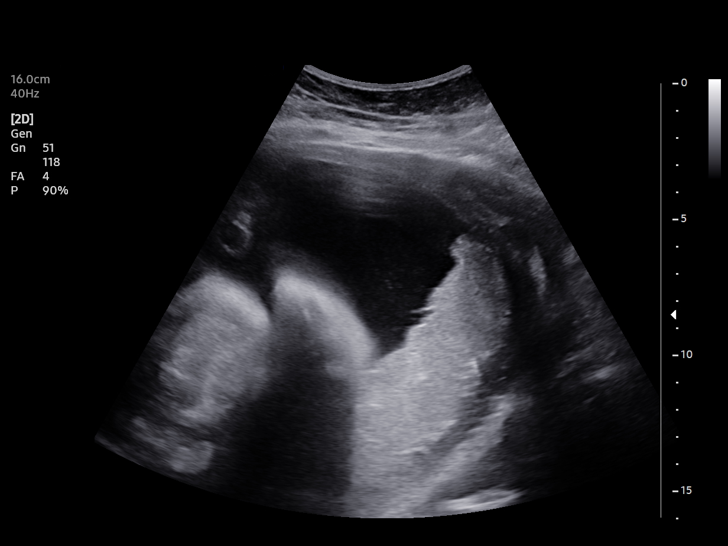
[im 10/13]
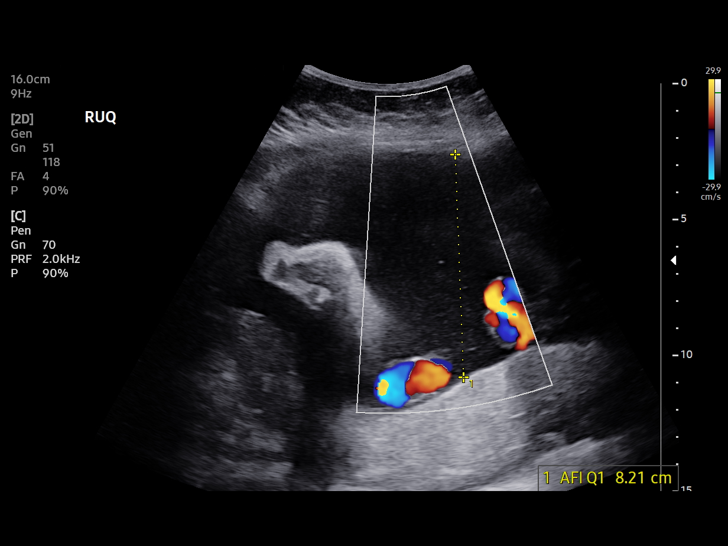
[im 11/13]
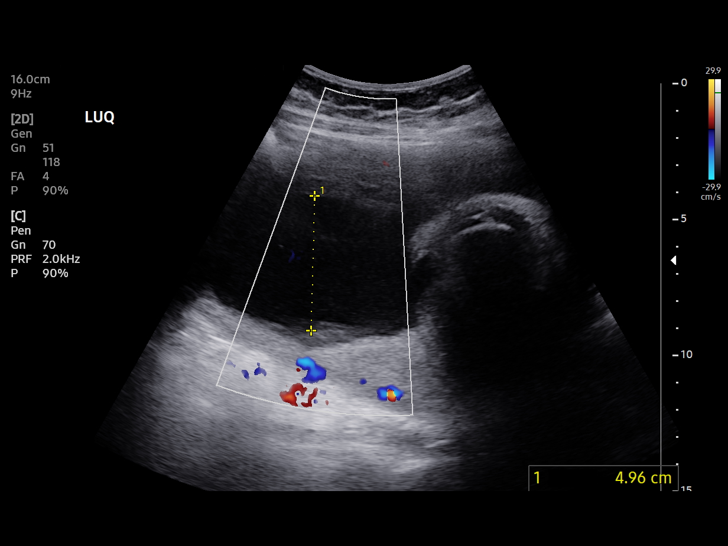
[im 12/13]
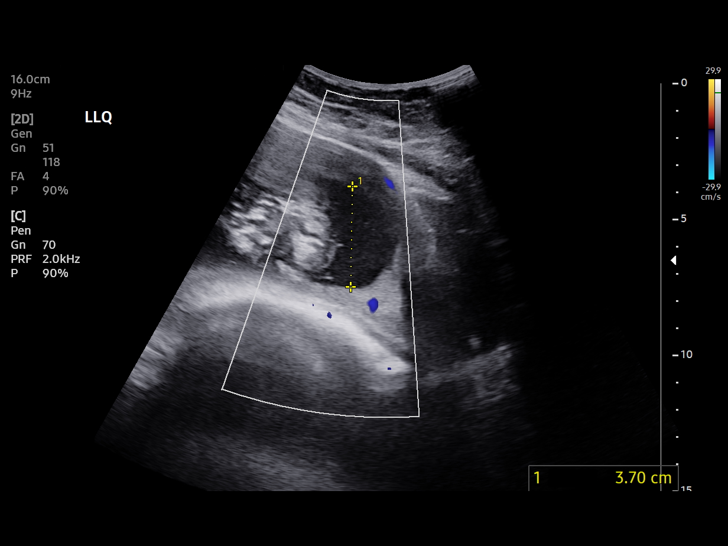
[im 13/13]
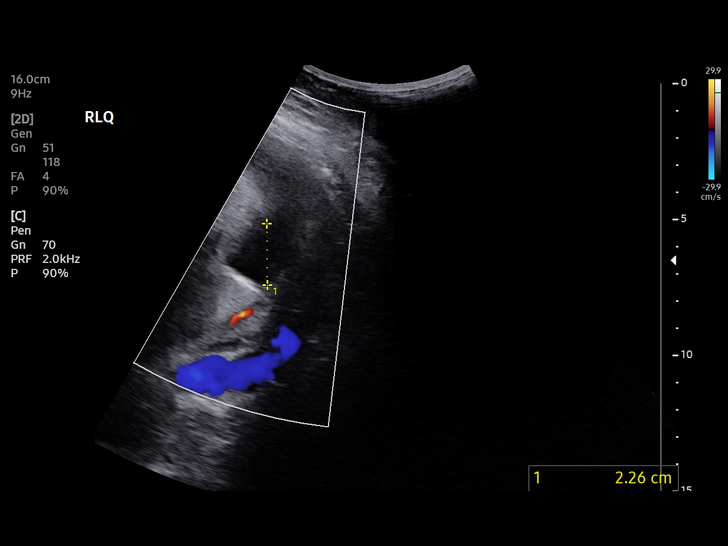

[13 of 13 positions shown; findings below may reference images not displayed]

[REDACTED]care at

 1  US FETAL BPP W/NONSTRESS              76818.4     GURVAR ACTON

Indications

 34 weeks gestation of pregnancy
 Gestational diabetes in pregnancy, diet
 controlled
Fetal Evaluation

 Num Of Fetuses:          1
 Preg. Location:          Intrauterine
 Cardiac Activity:        Observed
 Fetal Lie:               Maternal left side
 Presentation:            Cephalic

 Amniotic Fluid
 AFI FV:      Within normal limits

 AFI Sum(cm)     %Tile       Largest Pocket(cm)
 19.13           71

 RUQ(cm)       RLQ(cm)       LUQ(cm)        LLQ(cm)


 Comment:    BPP [DATE], AFI normal at 19. Cephalic position.
Biophysical Evaluation
 Amniotic F.V:   Pocket => 2 cm             F. Tone:         Observed
 F. Movement:    Observed                   N.S.T:           Reactive
 F. Breathing:   Observed                   Score:           [DATE]
OB History

 Gravidity:    1
Gestational Age

 LMP:           34w 4d        Date:  11/29/20                 EDD:   09/05/21
 Best:          34w 4d     Det. By:  LMP  (11/29/20)          EDD:   09/05/21
Impression

 BPP [DATE], AFI normal at 19. Cephalic position.
                 Spikes, Oliva

## 2023-04-11 ENCOUNTER — Ambulatory Visit: Payer: Medicaid Other | Admitting: Obstetrics and Gynecology

## 2023-04-19 ENCOUNTER — Encounter: Payer: Self-pay | Admitting: Student

## 2023-04-19 ENCOUNTER — Ambulatory Visit (INDEPENDENT_AMBULATORY_CARE_PROVIDER_SITE_OTHER): Payer: Medicaid Other | Admitting: Student

## 2023-04-19 VITALS — BP 131/80 | HR 99 | Wt 199.0 lb

## 2023-04-19 DIAGNOSIS — R300 Dysuria: Secondary | ICD-10-CM

## 2023-04-19 DIAGNOSIS — Z3201 Encounter for pregnancy test, result positive: Secondary | ICD-10-CM | POA: Diagnosis not present

## 2023-04-19 LAB — POCT PREGNANCY, URINE: Preg Test, Ur: POSITIVE — AB

## 2023-04-19 LAB — POCT URINALYSIS DIP (DEVICE)
Glucose, UA: 100 mg/dL — AB
Hgb urine dipstick: NEGATIVE
Ketones, ur: NEGATIVE mg/dL
Nitrite: NEGATIVE
Protein, ur: NEGATIVE mg/dL
Specific Gravity, Urine: >=1.03 (ref 1.005–1.030)
Urobilinogen, UA: >=8 mg/dL (ref 0.0–1.0)
pH: 6 (ref 5.0–8.0)

## 2023-04-19 MED ORDER — VITAFOL ULTRA 29-0.6-0.4-200 MG PO CAPS: 1.0000 | ORAL_CAPSULE | Freq: Every day | ORAL | 12 refills | Status: AC

## 2023-04-19 MED ORDER — NITROFURANTOIN MONOHYD MACRO 100 MG PO CAPS: 100.0000 mg | ORAL_CAPSULE | Freq: Two times a day (BID) | ORAL | 0 refills | Status: DC

## 2023-04-19 NOTE — Progress Notes (Signed)
  History:  Ms. Victoria Conner is a 27 y.o. G1P1001 who presents to clinic today for pain with intercourse.  Describes the pain comes during postcoital voids.  The following portions of the patient's history were reviewed and updated as appropriate: allergies, current medications, family history, past medical history, social history, past surgical history and problem list.  Review of Systems:  Review of Systems  Constitutional: Negative.   Respiratory:  Negative for cough.   Cardiovascular:  Negative for chest pain.  Gastrointestinal:  Negative for nausea and vomiting.  Genitourinary:  Positive for dysuria. Negative for frequency and urgency.  Neurological:  Negative for dizziness and headaches.  Psychiatric/Behavioral:  Negative for depression. The patient is not nervous/anxious.       Objective:  Physical Exam BP 131/80   Pulse 99   Wt 199 lb (90.3 kg)   LMP 11/30/2022 (Exact Date)   Breastfeeding No   BMI 37.60 kg/m  Physical Exam Constitutional:      Appearance: Normal appearance. She is obese.  Cardiovascular:     Rate and Rhythm: Normal rate.  Pulmonary:     Effort: Pulmonary effort is normal.  Genitourinary:    Comments: deferred Neurological:     Mental Status: She is alert.  Psychiatric:        Mood and Affect: Mood normal.        Behavior: Behavior normal.        Thought Content: Thought content normal.     Labs and Imaging Results for orders placed or performed in visit on 04/19/23 (from the past 24 hour(s))  Pregnancy, urine POC     Status: Abnormal   Collection Time: 04/19/23 11:47 AM  Result Value Ref Range   Preg Test, Ur POSITIVE (A) NEGATIVE  POCT urinalysis dip (device)     Status: Abnormal   Collection Time: 04/19/23 11:52 AM  Result Value Ref Range   Glucose, UA 100 (A) NEGATIVE mg/dL   Bilirubin Urine SMALL (A) NEGATIVE   Ketones, ur NEGATIVE NEGATIVE mg/dL   Specific Gravity, Urine >=1.030 1.005 - 1.030   Hgb urine dipstick NEGATIVE  NEGATIVE   pH 6.0 5.0 - 8.0   Protein, ur NEGATIVE NEGATIVE mg/dL   Urobilinogen, UA >=1.4 0.0 - 1.0 mg/dL   Nitrite NEGATIVE NEGATIVE   Leukocytes,Ua SMALL (A) NEGATIVE    No results found.  Health Maintenance Due  Topic Date Due   HPV VACCINES (1 - 3-dose series) Never done   INFLUENZA VACCINE  Never done    Labs, imaging and previous visits in Epic and Care Everywhere reviewed  Assessment & Plan:  1. Positive pregnancy test - Unplanned pregnancy - Patient would like to pursue follow-up prenatal care with [facility] - Prenat-Fe Poly-Methfol-FA-DHA (VITAFOL ULTRA) 29-0.6-0.4-200 MG CAPS; Take 1 capsule by mouth daily.  Dispense: 30 capsule; Refill: 12 - CBC/D/Plt+RPR+Rh+ABO+RubIgG...; Future - Culture, OB Urine; Future - [place] MFM OB DETAIL +14 WK; Future  2. Dysuria - UA positive for small Leukocytes & glucose - Due to positive pregnancy test and symptoms, will treat with Macrobid - nitrofurantoin, macrocrystal-monohydrate, (MACROBID) 100 MG capsule; Take 1 capsule (100 mg total) by mouth 2 (two) times daily.  Dispense: 14 capsule; Refill: 0   Approximately 20 minutes of total time was spent with this patient on care and counseling  Return in about 1 week (around 04/26/2023) for New OB.  Corlis Hove, NP 04/19/2023 12:43 PM

## 2023-05-03 ENCOUNTER — Encounter: Payer: Medicaid Other | Admitting: Family Medicine

## 2023-05-03 ENCOUNTER — Encounter: Payer: Self-pay | Admitting: Family Medicine

## 2023-05-09 DIAGNOSIS — O09299 Supervision of pregnancy with other poor reproductive or obstetric history, unspecified trimester: Secondary | ICD-10-CM | POA: Insufficient documentation

## 2023-05-09 DIAGNOSIS — O34219 Maternal care for unspecified type scar from previous cesarean delivery: Secondary | ICD-10-CM | POA: Insufficient documentation

## 2023-05-18 ENCOUNTER — Ambulatory Visit: Payer: Medicaid Other

## 2023-05-18 ENCOUNTER — Other Ambulatory Visit: Payer: Medicaid Other

## 2023-05-18 DIAGNOSIS — O34219 Maternal care for unspecified type scar from previous cesarean delivery: Secondary | ICD-10-CM

## 2023-05-18 DIAGNOSIS — O09299 Supervision of pregnancy with other poor reproductive or obstetric history, unspecified trimester: Secondary | ICD-10-CM

## 2023-06-02 ENCOUNTER — Other Ambulatory Visit: Payer: Self-pay

## 2023-06-02 ENCOUNTER — Ambulatory Visit (INDEPENDENT_AMBULATORY_CARE_PROVIDER_SITE_OTHER): Payer: Medicaid Other

## 2023-06-02 VITALS — BP 135/86 | HR 93 | Wt 204.6 lb

## 2023-06-02 DIAGNOSIS — O133 Gestational [pregnancy-induced] hypertension without significant proteinuria, third trimester: Secondary | ICD-10-CM | POA: Diagnosis not present

## 2023-06-02 DIAGNOSIS — O09299 Supervision of pregnancy with other poor reproductive or obstetric history, unspecified trimester: Secondary | ICD-10-CM

## 2023-06-02 DIAGNOSIS — Z8632 Personal history of gestational diabetes: Secondary | ICD-10-CM | POA: Diagnosis not present

## 2023-06-02 DIAGNOSIS — O285 Abnormal chromosomal and genetic finding on antenatal screening of mother: Secondary | ICD-10-CM | POA: Diagnosis not present

## 2023-06-02 DIAGNOSIS — Z3A26 26 weeks gestation of pregnancy: Secondary | ICD-10-CM

## 2023-06-02 DIAGNOSIS — Z3480 Encounter for supervision of other normal pregnancy, unspecified trimester: Secondary | ICD-10-CM | POA: Insufficient documentation

## 2023-06-02 NOTE — Progress Notes (Signed)
New OB Intake  Patient came in person for new OB intake. Swahili Lucy interpreter assist with the visit.   I discussed the limitations, risks, security and privacy concerns of performing an evaluation and management service by telephone and the availability of in person appointments. I also discussed with the patient that there may be a patient responsible charge related to this service. The patient expressed understanding and agreed to proceed.  I explained I am completing New OB Intake today. We discussed EDD of 09/06/2023, by Last Menstrual Period. Pt is G2P1001. I reviewed her allergies, medications and Medical/Surgical/OB history.    Patient Active Problem List   Diagnosis Date Noted   Supervision of other normal pregnancy, antepartum 06/02/2023   Previous cesarean delivery affecting pregnancy, antepartum 05/09/2023   History of gestational diabetes in prior pregnancy, currently pregnant 05/09/2023   Iron deficiency anemia 08/21/2021   Hx of Gestational hypertension 08/19/2021   Language barrier affecting health care 05/28/2021   Abnormal chromosomal and genetic finding on antenatal screening mother 05/28/2021    Concerns addressed today: -Was not able to hear baby heart beat through doppler, patient kept saying it hurts. Diane RN was able to view baby through ultrasound with 143 BPM. -Patient would like to discuss VBAC.  -Patient will do 2 hours GTT at her initial OB visit, lab scheduled.   Delivery Plans Plans to deliver at [facility]. Discussed the nature of our practice with multiple providers including residents and students. Due to the size of the practice, the delivering provider may not be the same as those providing prenatal care.   Patient is not interested in water birth. Offered upcoming OB visit with CNM to discuss further.  MyChart/Babyscripts MyChart access verified. I explained pt will have some visits in office and some virtually. Babyscripts instructions given and  order placed. Patient verifies receipt of registration text/e-mail. Account successfully created and app downloaded. If patient is a candidate for Optimized scheduling, add to sticky note.   Blood Pressure Cuff/Weight Scale Blood pressure cuff ordered for patient to pick-up from [employer]. Explained after first prenatal appt pt will check weekly and document in Babyscripts. Patient does not have weight scale; patient may purchase if they desire to track weight weekly in Babyscripts.  Anatomy US Explained first scheduled [place] will be around 19 weeks. Anatomy US scheduled for 06/30/2023 at 7:15AM.  Is patient a CenteringPregnancy candidate?  Not a candidate due to Language barrier If accepted,    Is patient a Mom+Baby Combined Care candidate?  Not a candidate   If accepted, confirm patient does not intend to move from the area for at least 12 months, then notify Mom+Baby staff  Interested in [place]? If yes, send referral and doula dot phrase.   Is patient a candidate for Babyscripts Optimization? No, due to [employer]   First visit review I reviewed new OB appt with patient. Explained pt will be seen by Sue Lush, FNP  at first visit. Discussed Avelina Laine genetic screening with patient. Yes Panor[facility]. Routine prenatal labs is   Last Pap Diagnosis  Date Value Ref Range Status  05/28/2021   Final   - Negative for intraepithelial lesion or malignancy (NILM)    Vidal Schwalbe, CMA 06/02/2023  1:32 PM

## 2023-06-03 LAB — PROTEIN / CREATININE RATIO, URINE
Creatinine, Urine: 126.7 mg/dL
Protein, Ur: 28.6 mg/dL
Protein/Creat Ratio: 226 mg/g{creat} — ABNORMAL HIGH (ref 0–200)

## 2023-06-04 LAB — CBC/D/PLT+RPR+RH+ABO+RUBIGG...
Antibody Screen: NEGATIVE
Basophils Absolute: 0 10*3/uL (ref 0.0–0.2)
Basos: 0 %
EOS (ABSOLUTE): 0.1 10*3/uL (ref 0.0–0.4)
Eos: 1 %
HCV Ab: NONREACTIVE
HIV Screen 4th Generation wRfx: NONREACTIVE
Hematocrit: 26.5 % — ABNORMAL LOW (ref 34.0–46.6)
Hemoglobin: 7.3 g/dL — ABNORMAL LOW (ref 11.1–15.9)
Hepatitis B Surface Ag: NEGATIVE
Immature Grans (Abs): 0 10*3/uL (ref 0.0–0.1)
Immature Granulocytes: 0 %
Lymphocytes Absolute: 2.1 10*3/uL (ref 0.7–3.1)
Lymphs: 30 %
MCH: 18 pg — ABNORMAL LOW (ref 26.6–33.0)
MCHC: 27.5 g/dL — ABNORMAL LOW (ref 31.5–35.7)
MCV: 65 fL — ABNORMAL LOW (ref 79–97)
Monocytes Absolute: 0.4 10*3/uL (ref 0.1–0.9)
Monocytes: 5 %
Neutrophils Absolute: 4.3 10*3/uL (ref 1.4–7.0)
Neutrophils: 64 %
Platelets: 200 10*3/uL (ref 150–450)
RBC: 4.06 x10E6/uL (ref 3.77–5.28)
RDW: 18 % — ABNORMAL HIGH (ref 11.7–15.4)
RPR Ser Ql: NONREACTIVE
Rh Factor: POSITIVE
Rubella Antibodies, IGG: 5.42 {index} (ref >0.99)
WBC: 6.9 10*3/uL (ref 3.4–10.8)

## 2023-06-04 LAB — COMPREHENSIVE METABOLIC PANEL
ALT: 7 [IU]/L (ref 0–32)
AST: 14 [IU]/L (ref 0–40)
Albumin: 3.3 g/dL — ABNORMAL LOW (ref 4.0–5.0)
Alkaline Phosphatase: 151 [IU]/L — ABNORMAL HIGH (ref 44–121)
BUN/Creatinine Ratio: 9 (ref 9–23)
BUN: 5 mg/dL — ABNORMAL LOW (ref 6–20)
Bilirubin Total: 0.6 mg/dL (ref 0.0–1.2)
CO2: 19 mmol/L — ABNORMAL LOW (ref 20–29)
Calcium: 8.6 mg/dL — ABNORMAL LOW (ref 8.7–10.2)
Chloride: 104 mmol/L (ref 96–106)
Creatinine, Ser: 0.54 mg/dL — ABNORMAL LOW (ref 0.57–1.00)
Globulin, Total: 3 g/dL (ref 1.5–4.5)
Glucose: 86 mg/dL (ref 70–99)
Potassium: 4.2 mmol/L (ref 3.5–5.2)
Sodium: 136 mmol/L (ref 134–144)
Total Protein: 6.3 g/dL (ref 6.0–8.5)
eGFR: 130 mL/min/{1.73_m2} (ref >59)

## 2023-06-04 LAB — HEMOGLOBIN A1C
Est. average glucose Bld gHb Est-mCnc: 117 mg/dL
Hgb A1c MFr Bld: 5.7 % — ABNORMAL HIGH (ref 4.8–5.6)

## 2023-06-04 LAB — HCV INTERPRETATION

## 2023-06-04 LAB — TSH: TSH: 1.14 u[IU]/mL (ref 0.450–4.500)

## 2023-06-05 LAB — URINE CULTURE, OB REFLEX

## 2023-06-05 LAB — CULTURE, OB URINE

## 2023-06-07 ENCOUNTER — Other Ambulatory Visit: Payer: Self-pay

## 2023-06-07 DIAGNOSIS — Z3480 Encounter for supervision of other normal pregnancy, unspecified trimester: Secondary | ICD-10-CM

## 2023-06-07 DIAGNOSIS — Z3490 Encounter for supervision of normal pregnancy, unspecified, unspecified trimester: Secondary | ICD-10-CM

## 2023-06-08 ENCOUNTER — Telehealth: Payer: Self-pay

## 2023-06-08 ENCOUNTER — Other Ambulatory Visit: Payer: Medicaid Other

## 2023-06-08 ENCOUNTER — Ambulatory Visit: Payer: Medicaid Other | Admitting: Obstetrics and Gynecology

## 2023-06-08 ENCOUNTER — Other Ambulatory Visit (HOSPITAL_COMMUNITY)
Admission: RE | Admit: 2023-06-08 | Discharge: 2023-06-08 | Disposition: A | Discharge status: Home / Self Care | Payer: Medicaid Other | Source: Ambulatory Visit | Attending: Obstetrics and Gynecology | Admitting: Obstetrics and Gynecology

## 2023-06-08 VITALS — BP 121/75 | HR 110 | Wt 204.0 lb

## 2023-06-08 DIAGNOSIS — Z3492 Encounter for supervision of normal pregnancy, unspecified, second trimester: Secondary | ICD-10-CM | POA: Insufficient documentation

## 2023-06-08 DIAGNOSIS — Z3480 Encounter for supervision of other normal pregnancy, unspecified trimester: Secondary | ICD-10-CM

## 2023-06-08 DIAGNOSIS — O99019 Anemia complicating pregnancy, unspecified trimester: Secondary | ICD-10-CM | POA: Diagnosis not present

## 2023-06-08 DIAGNOSIS — Z3A27 27 weeks gestation of pregnancy: Secondary | ICD-10-CM | POA: Diagnosis not present

## 2023-06-08 DIAGNOSIS — Z8759 Personal history of other complications of pregnancy, childbirth and the puerperium: Secondary | ICD-10-CM | POA: Diagnosis not present

## 2023-06-08 DIAGNOSIS — O99891 Other specified diseases and conditions complicating pregnancy: Secondary | ICD-10-CM

## 2023-06-08 DIAGNOSIS — R8271 Bacteriuria: Secondary | ICD-10-CM

## 2023-06-08 DIAGNOSIS — Z98891 History of uterine scar from previous surgery: Secondary | ICD-10-CM

## 2023-06-08 DIAGNOSIS — Z8632 Personal history of gestational diabetes: Secondary | ICD-10-CM

## 2023-06-08 DIAGNOSIS — O09299 Supervision of pregnancy with other poor reproductive or obstetric history, unspecified trimester: Secondary | ICD-10-CM | POA: Diagnosis not present

## 2023-06-08 MED ORDER — CEFADROXIL 500 MG PO CAPS: 500.0000 mg | ORAL_CAPSULE | Freq: Two times a day (BID) | ORAL | 0 refills | Status: DC

## 2023-06-08 MED ORDER — ASPIRIN 81 MG PO TBEC: 162.0000 mg | DELAYED_RELEASE_TABLET | Freq: Every day | ORAL | 2 refills | Status: DC

## 2023-06-08 NOTE — Addendum Note (Signed)
Addended by: Ihor Austin D on: 06/08/2023 11:44 AM   Modules accepted: Orders

## 2023-06-08 NOTE — Telephone Encounter (Signed)
Victoria Conner, patient will be scheduled as soon as possible.  Auth Submission: NO AUTH NEEDED Site of care: Site of care: [facility] Payer: [place] Medicaid [employer] Medication & CPT/J Code(s) submitted: Venofer (Iron Sucrose) J1756 Route of submission (phone, fax, portal):  Phone # Fax # Auth type: Buy/Bill PB Units/visits requested: 500mg  x 2 doses Reference number:  Approval from: 06/08/23 to 06/21/23

## 2023-06-08 NOTE — Telephone Encounter (Signed)
Patient referred to infusion pharmacy team for ambulatory infusion of IV iron.  Insurance - [place] Medicaid   Dx code - O99.019   IV Iron Therapy - NP would like Venofer 500 mg IV x 2  Infusion appointments - Scheduling team will schedule patient as soon as possible.     Demetrius Charity, PharmD

## 2023-06-08 NOTE — Progress Notes (Signed)
INITIAL PRENATAL VISIT  Subjective:   Danon Parkison is being seen today for her first obstetrical visit. This is a desired pregnancy.  She is at [redacted]w[redacted]d gestation by LMP. Her obstetrical history is significant for  gestational hypertension, gestational diabetes, cesarean section . Relationship with FOB: significant other, living together. Patient does intend to breast feed. Pregnancy history fully reviewed.  Patient reports no complaints.  Indications for ASA therapy (per uptodate) One of the following: Previous pregnancy with preeclampsia, especially early onset and with an adverse outcome No -GHTN Multifetal gestation No Chronic hypertension No Type 1 or 2 diabetes mellitus No -GDM Chronic kidney disease No Autoimmune disease (antiphospholipid syndrome, systemic lupus erythematosus) No  Two or more of the following: Nulliparity No Obesity (body mass index >30 kg/m2) Yes Family history of preeclampsia in mother or sister No Age >=35 years No Sociodemographic characteristics (African American race, low socioeconomic level) Yes Personal risk factors (eg, previous pregnancy with low birth weight or small for gestational age infant, previous adverse pregnancy outcome [eg, stillbirth], interval >10 years between pregnancies) No   Objective:    Obstetric History OB History  Gravida Para Term Preterm AB Living  2 1 1  0 0 1  SAB IAB Ectopic Multiple Live Births  0 0 0 0 1    # Outcome Date GA Lbr Len/2nd Weight Sex Type Anes PTL Lv  2 Current           1 Term 08/19/21 [redacted]w[redacted]d  5 lb 10.3 oz (2.56 kg) M CS-LTranv Spinal  LIV    Past Medical History:  Diagnosis Date   Abnormal chromosomal and genetic finding on antenatal screening mother 05/28/2021   Breech presentation, single or unspecified fetus 08/19/2021   Gestational hypertension 08/19/2021   Malpresentation before onset of labor 08/19/2021   Medical history non-contributory    S/P primary low transverse C-section  08/19/2021   Uterine size date discrepancy pregnancy, second trimester 05/28/2021    Past Surgical History:  Procedure Laterality Date   CESAREAN SECTION  08/19/2021   Procedure: CESAREAN SECTION;  Surgeon: Federico Flake, MD;  Location: [facility];  Service: Obstetrics;;   NO PAST SURGERIES      Current Outpatient Medications on File Prior to Visit  Medication Sig Dispense Refill   Prenat-Fe Poly-Methfol-FA-DHA (VITAFOL ULTRA) 29-0.6-0.4-200 MG CAPS Take 1 capsule by mouth daily. 30 capsule 12   No current facility-administered medications on file prior to visit.    No Known Allergies  Social History:  reports that she has never smoked. She has never been exposed to tobacco smoke. She has never used smokeless tobacco. She reports that she does not drink alcohol and does not use drugs.  Family History  Problem Relation Age of Onset   Hypertension Mother     The following portions of the patient's history were reviewed and updated as appropriate: allergies, current medications, past family history, past medical history, past social history, past surgical history and problem list.  Review of Systems Review of Systems  All other systems reviewed and are negative.   Physical Exam:  BP 121/75   Pulse (!) 110   Wt 204 lb (92.5 kg)   LMP 11/30/2022 (Exact Date)   BMI 38.55 kg/m  CONSTITUTIONAL: Well-developed, well-nourished female in no acute distress.  HENT:  Normocephalic, atraumatic.   EYES: Conjunctivae normal. No scleral icterus.  NECK: Normal range of motion, supple, no masses.  Normal thyroid.  SKIN: Skin is warm and dry MUSCULOSKELETAL: Normal  range of motion NEUROLOGIC: Alert and oriented  PSYCHIATRIC: Normal mood and affect. Normal behavior. Normal judgment and thought content. CARDIOVASCULAR: Normal heart rate noted RESPIRATORY: normal effort ABDOMEN: Soft PELVIC: deferred  Fetal Heart Rate (bpm): 154   Movement: Present       Assessment:     Pregnancy: G2P1001  1. Supervision of low-risk pregnancy, second trimester (Primary) BP and FHR normal Doing well, feeling regular movement  Initial labs drawn reviewed Prenatal vitamins. Problem list reviewed and updated. Reviewed in detail the nature of the practice with collaborative care between  Genetic screening discussed: NIPS/First trimester screen/Quad/AFP ordered. Role of ultrasound in pregnancy discussed; Anatomy US: ordered.   2. Anemia during pregnancy Hgb 7.3, no s&s, IV venofer ordered - Amb Referral to Intravenous Iron Therapy  3. History of gestational diabetes in prior pregnancy, currently pregnant A1c 5.7, GTT and labs today   4. History of gestational hypertension Start ASA  5. [redacted] weeks gestation of pregnancy   6. History of cesarean section 37 weeks for ghtn and uncontrolled GDM Desires TOLAC Anatomy u/s 1/9  7. Asymptomatic bacteriuria during pregnancy Rx sent for abx   Follow up in 4 weeks. Discussed clinic routines, schedule of care and testing, genetic screening options, involvement of students and residents under the direct supervision of APPs and doctors and presence of female providers. Pt verbalized understanding.  Return in 2 weeks for routine prenatal    Sue Lush, FNP

## 2023-06-09 LAB — CBC
Hematocrit: 25.3 % — ABNORMAL LOW (ref 34.0–46.6)
Hemoglobin: 7.1 g/dL — ABNORMAL LOW (ref 11.1–15.9)
MCH: 18.3 pg — ABNORMAL LOW (ref 26.6–33.0)
MCHC: 28.1 g/dL — ABNORMAL LOW (ref 31.5–35.7)
MCV: 65 fL — ABNORMAL LOW (ref 79–97)
NRBC: 1 % — ABNORMAL HIGH (ref 0–0)
Platelets: 246 10*3/uL (ref 150–450)
RBC: 3.89 x10E6/uL (ref 3.77–5.28)
RDW: 18.5 % — ABNORMAL HIGH (ref 11.7–15.4)
WBC: 7.9 10*3/uL (ref 3.4–10.8)

## 2023-06-09 LAB — GC/CHLAMYDIA PROBE AMP (~~LOC~~) NOT AT ARMC
Chlamydia: NEGATIVE
Comment: NEGATIVE
Comment: NORMAL
Neisseria Gonorrhea: NEGATIVE

## 2023-06-09 LAB — GLUCOSE TOLERANCE, 2 HOURS W/ 1HR
Glucose, 1 hour: 168 mg/dL (ref 70–179)
Glucose, 2 hour: 148 mg/dL (ref 70–152)
Glucose, Fasting: 89 mg/dL (ref 70–91)

## 2023-06-09 LAB — HIV ANTIBODY (ROUTINE TESTING W REFLEX): HIV Screen 4th Generation wRfx: NONREACTIVE

## 2023-06-09 LAB — RPR: RPR Ser Ql: NONREACTIVE

## 2023-06-10 LAB — PANORAMA PRENATAL TEST FULL PANEL:PANORAMA TEST PLUS 5 ADDITIONAL MICRODELETIONS: FETAL FRACTION: 9.7

## 2023-06-17 ENCOUNTER — Telehealth: Payer: Self-pay | Admitting: General Practice

## 2023-06-17 NOTE — Telephone Encounter (Signed)
Called patient with pacific interpreter [phone], no answer- left message to call us back for results. Also left message on secondary phone number.

## 2023-06-17 NOTE — Telephone Encounter (Signed)
-----   Message from [facility] sent at 06/13/2023 12:34 PM EST ----- Notify of normal panorama and gender if she wishes

## 2023-06-23 ENCOUNTER — Encounter: Payer: Self-pay | Admitting: *Deleted

## 2023-06-30 ENCOUNTER — Ambulatory Visit: Payer: Medicaid Other

## 2023-07-04 ENCOUNTER — Ambulatory Visit: Payer: Medicaid Other

## 2023-07-04 VITALS — BP 154/86 | HR 89 | Temp 98.0°F | Resp 20 | Wt 213.0 lb

## 2023-07-04 DIAGNOSIS — D508 Other iron deficiency anemias: Secondary | ICD-10-CM | POA: Diagnosis not present

## 2023-07-04 DIAGNOSIS — O99013 Anemia complicating pregnancy, third trimester: Secondary | ICD-10-CM | POA: Diagnosis not present

## 2023-07-04 DIAGNOSIS — O99012 Anemia complicating pregnancy, second trimester: Secondary | ICD-10-CM

## 2023-07-04 DIAGNOSIS — Z3A30 30 weeks gestation of pregnancy: Secondary | ICD-10-CM | POA: Diagnosis not present

## 2023-07-04 MED ORDER — ACETAMINOPHEN 325 MG PO TABS
650.0000 mg | ORAL_TABLET | Freq: Once | ORAL | Status: AC
  Administered 2023-07-04: 650 mg via ORAL
  Filled 2023-07-04: qty 2

## 2023-07-04 MED ORDER — DIPHENHYDRAMINE HCL 25 MG PO CAPS
25.0000 mg | ORAL_CAPSULE | Freq: Once | ORAL | Status: AC
  Administered 2023-07-04: 25 mg via ORAL
  Filled 2023-07-04: qty 1

## 2023-07-04 MED ORDER — IRON SUCROSE 500 MG IVPB - SIMPLE MED
500.0000 mg | Freq: Once | INTRAVENOUS | Status: AC
  Administered 2023-07-04: 500 mg via INTRAVENOUS
  Filled 2023-07-04: qty 275

## 2023-07-04 NOTE — Progress Notes (Signed)
 Diagnosis: Iron  Deficiency Anemia  Provider:  Praveen Mannam MD  Procedure: IV Infusion  IV Type: Peripheral, IV Location: R Antecubital  Venofer  (Iron  Sucrose), Dose: 500 mg  Infusion Start Time: 0852  Infusion Stop Time: 1317  Post Infusion IV Care: Observation period completed and Peripheral IV Discontinued  Discharge: Condition: Good, Destination: Home . AVS Declined  Performed by:  Maximiano JONELLE Pouch, LPN

## 2023-07-08 ENCOUNTER — Encounter: Payer: Medicaid Other | Admitting: Family Medicine

## 2023-07-08 DIAGNOSIS — Z8759 Personal history of other complications of pregnancy, childbirth and the puerperium: Secondary | ICD-10-CM

## 2023-07-08 DIAGNOSIS — O99019 Anemia complicating pregnancy, unspecified trimester: Secondary | ICD-10-CM

## 2023-07-08 DIAGNOSIS — Z3480 Encounter for supervision of other normal pregnancy, unspecified trimester: Secondary | ICD-10-CM

## 2023-07-08 DIAGNOSIS — Z98891 History of uterine scar from previous surgery: Secondary | ICD-10-CM

## 2023-07-08 DIAGNOSIS — O09299 Supervision of pregnancy with other poor reproductive or obstetric history, unspecified trimester: Secondary | ICD-10-CM

## 2023-07-09 ENCOUNTER — Other Ambulatory Visit: Payer: Self-pay

## 2023-07-09 ENCOUNTER — Inpatient Hospital Stay (HOSPITAL_COMMUNITY)
Admission: AD | Admit: 2023-07-09 | Discharge: 2023-07-12 | DRG: 787 | Disposition: A | Discharge status: Home / Self Care | Payer: Medicaid Other | Attending: Obstetrics and Gynecology | Admitting: Obstetrics and Gynecology

## 2023-07-09 ENCOUNTER — Encounter (HOSPITAL_COMMUNITY)
Admission: AD | Disposition: A | Discharge status: Home / Self Care | Payer: Self-pay | Source: Home / Self Care | Attending: Obstetrics & Gynecology

## 2023-07-09 ENCOUNTER — Inpatient Hospital Stay (HOSPITAL_COMMUNITY): Payer: Medicaid Other | Admitting: Anesthesiology

## 2023-07-09 ENCOUNTER — Encounter (HOSPITAL_COMMUNITY): Payer: Self-pay | Admitting: Obstetrics & Gynecology

## 2023-07-09 DIAGNOSIS — Z3A31 31 weeks gestation of pregnancy: Secondary | ICD-10-CM

## 2023-07-09 DIAGNOSIS — Z8249 Family history of ischemic heart disease and other diseases of the circulatory system: Secondary | ICD-10-CM | POA: Diagnosis not present

## 2023-07-09 DIAGNOSIS — Z560 Unemployment, unspecified: Secondary | ICD-10-CM

## 2023-07-09 DIAGNOSIS — O1414 Severe pre-eclampsia complicating childbirth: Secondary | ICD-10-CM | POA: Diagnosis present

## 2023-07-09 DIAGNOSIS — D62 Acute posthemorrhagic anemia: Secondary | ICD-10-CM | POA: Diagnosis not present

## 2023-07-09 DIAGNOSIS — Z603 Acculturation difficulty: Secondary | ICD-10-CM | POA: Diagnosis present

## 2023-07-09 DIAGNOSIS — O132 Gestational [pregnancy-induced] hypertension without significant proteinuria, second trimester: Principal | ICD-10-CM

## 2023-07-09 DIAGNOSIS — O34211 Maternal care for low transverse scar from previous cesarean delivery: Secondary | ICD-10-CM | POA: Diagnosis not present

## 2023-07-09 DIAGNOSIS — O9081 Anemia of the puerperium: Secondary | ICD-10-CM | POA: Diagnosis not present

## 2023-07-09 DIAGNOSIS — O328XX0 Maternal care for other malpresentation of fetus, not applicable or unspecified: Secondary | ICD-10-CM | POA: Diagnosis present

## 2023-07-09 DIAGNOSIS — Z3490 Encounter for supervision of normal pregnancy, unspecified, unspecified trimester: Secondary | ICD-10-CM

## 2023-07-09 DIAGNOSIS — Z3480 Encounter for supervision of other normal pregnancy, unspecified trimester: Secondary | ICD-10-CM

## 2023-07-09 DIAGNOSIS — O321XX0 Maternal care for breech presentation, not applicable or unspecified: Secondary | ICD-10-CM | POA: Diagnosis not present

## 2023-07-09 DIAGNOSIS — Z98891 History of uterine scar from previous surgery: Secondary | ICD-10-CM

## 2023-07-09 DIAGNOSIS — O26893 Other specified pregnancy related conditions, third trimester: Secondary | ICD-10-CM | POA: Diagnosis present

## 2023-07-09 DIAGNOSIS — O42013 Preterm premature rupture of membranes, onset of labor within 24 hours of rupture, third trimester: Secondary | ICD-10-CM | POA: Diagnosis not present

## 2023-07-09 LAB — CBC
HCT: 28.5 % — ABNORMAL LOW (ref 36.0–46.0)
Hemoglobin: 8 g/dL — ABNORMAL LOW (ref 12.0–15.0)
MCH: 19.1 pg — ABNORMAL LOW (ref 26.0–34.0)
MCHC: 28.1 g/dL — ABNORMAL LOW (ref 30.0–36.0)
MCV: 68.2 fL — ABNORMAL LOW (ref 80.0–100.0)
Platelets: 214 10*3/uL (ref 150–400)
RBC: 4.18 MIL/uL (ref 3.87–5.11)
RDW: 25.2 % — ABNORMAL HIGH (ref 11.5–15.5)
WBC: 14.4 10*3/uL — ABNORMAL HIGH (ref 4.0–10.5)
nRBC: 2.8 % — ABNORMAL HIGH (ref 0.0–0.2)

## 2023-07-09 LAB — COMPREHENSIVE METABOLIC PANEL
ALT: 11 U/L (ref 0–44)
AST: 23 U/L (ref 15–41)
Albumin: 2.6 g/dL — ABNORMAL LOW (ref 3.5–5.0)
Alkaline Phosphatase: 155 U/L — ABNORMAL HIGH (ref 38–126)
Anion gap: 14 (ref 5–15)
BUN: 6 mg/dL (ref 6–20)
CO2: 17 mmol/L — ABNORMAL LOW (ref 22–32)
Calcium: 9 mg/dL (ref 8.9–10.3)
Chloride: 102 mmol/L (ref 98–111)
Creatinine, Ser: 0.63 mg/dL (ref 0.44–1.00)
GFR, Estimated: >60 mL/min (ref >60)
Glucose, Bld: 102 mg/dL — ABNORMAL HIGH (ref 70–99)
Potassium: 3.8 mmol/L (ref 3.5–5.1)
Sodium: 133 mmol/L — ABNORMAL LOW (ref 135–145)
Total Bilirubin: 1.9 mg/dL — ABNORMAL HIGH (ref 0.0–1.2)
Total Protein: 6.9 g/dL (ref 6.5–8.1)

## 2023-07-09 LAB — CREATININE, SERUM
Creatinine, Ser: 0.77 mg/dL (ref 0.44–1.00)
GFR, Estimated: >60 mL/min (ref >60)

## 2023-07-09 LAB — RPR: RPR Ser Ql: NONREACTIVE

## 2023-07-09 SURGERY — Surgical Case
Anesthesia: General

## 2023-07-09 MED ORDER — LABETALOL HCL 5 MG/ML IV SOLN
INTRAVENOUS | Status: AC
  Filled 2023-07-09: qty 4

## 2023-07-09 MED ORDER — ACETAMINOPHEN 10 MG/ML IV SOLN
INTRAVENOUS | Status: DC | PRN
  Administered 2023-07-09: 1000 mg via INTRAVENOUS

## 2023-07-09 MED ORDER — ONDANSETRON HCL 4 MG/2ML IJ SOLN
INTRAMUSCULAR | Status: DC | PRN
  Administered 2023-07-09: 4 mg via INTRAVENOUS

## 2023-07-09 MED ORDER — CEFAZOLIN SODIUM-DEXTROSE 2-4 GM/100ML-% IV SOLN
INTRAVENOUS | Status: AC
  Filled 2023-07-09: qty 100

## 2023-07-09 MED ORDER — OXYCODONE HCL 5 MG PO TABS: 5.0000 mg | ORAL_TABLET | Freq: Once | ORAL | Status: DC | PRN

## 2023-07-09 MED ORDER — LACTATED RINGERS IV SOLN: INTRAVENOUS | Status: AC

## 2023-07-09 MED ORDER — SIMETHICONE 80 MG PO CHEW
80.0000 mg | CHEWABLE_TABLET | Freq: Three times a day (TID) | ORAL | Status: DC
  Administered 2023-07-09 – 2023-07-12 (×9): 80 mg via ORAL
  Filled 2023-07-09 (×9): qty 1

## 2023-07-09 MED ORDER — LABETALOL HCL 5 MG/ML IV SOLN
80.0000 mg | INTRAVENOUS | Status: DC | PRN
Start: 2023-07-09 — End: 2023-07-09

## 2023-07-09 MED ORDER — MAGNESIUM SULFATE 40 GM/1000ML IV SOLN
2.0000 g/h | INTRAVENOUS | Status: DC
  Administered 2023-07-09 – 2023-07-10 (×2): 2 g/h via INTRAVENOUS
  Filled 2023-07-09: qty 1000

## 2023-07-09 MED ORDER — FENTANYL CITRATE (PF) 250 MCG/5ML IJ SOLN
INTRAMUSCULAR | Status: AC
  Filled 2023-07-09: qty 5

## 2023-07-09 MED ORDER — SIMETHICONE 80 MG PO CHEW: 80.0000 mg | CHEWABLE_TABLET | ORAL | Status: DC | PRN

## 2023-07-09 MED ORDER — OXYCODONE HCL 5 MG PO TABS: 5.0000 mg | ORAL_TABLET | ORAL | Status: DC | PRN

## 2023-07-09 MED ORDER — MEASLES, MUMPS & RUBELLA VAC IJ SOLR: 0.5000 mL | Freq: Once | INTRAMUSCULAR | Status: DC

## 2023-07-09 MED ORDER — LABETALOL HCL 5 MG/ML IV SOLN
40.0000 mg | INTRAVENOUS | Status: DC | PRN
Start: 2023-07-09 — End: 2023-07-09

## 2023-07-09 MED ORDER — MAGNESIUM SULFATE 40 GM/1000ML IV SOLN
INTRAVENOUS | Status: AC
  Filled 2023-07-09: qty 1000

## 2023-07-09 MED ORDER — SODIUM CHLORIDE 0.9 % IV SOLN
INTRAVENOUS | Status: AC
  Filled 2023-07-09: qty 5

## 2023-07-09 MED ORDER — TRIAMCINOLONE ACETONIDE 40 MG/ML IJ SUSP
INTRAMUSCULAR | Status: AC
  Filled 2023-07-09: qty 1

## 2023-07-09 MED ORDER — ENOXAPARIN SODIUM 60 MG/0.6ML IJ SOSY: 50.0000 mg | PREFILLED_SYRINGE | INTRAMUSCULAR | Status: DC

## 2023-07-09 MED ORDER — MEDROXYPROGESTERONE ACETATE 150 MG/ML IM SUSP: 150.0000 mg | INTRAMUSCULAR | Status: DC | PRN

## 2023-07-09 MED ORDER — ZOLPIDEM TARTRATE 5 MG PO TABS: 5.0000 mg | ORAL_TABLET | Freq: Every evening | ORAL | Status: DC | PRN

## 2023-07-09 MED ORDER — HYDRALAZINE HCL 20 MG/ML IJ SOLN: 10.0000 mg | INTRAMUSCULAR | Status: DC | PRN

## 2023-07-09 MED ORDER — CEFAZOLIN SODIUM-DEXTROSE 2-3 GM-%(50ML) IV SOLR
INTRAVENOUS | Status: DC | PRN
  Administered 2023-07-09: 2 g via INTRAVENOUS

## 2023-07-09 MED ORDER — NIFEDIPINE 10 MG PO CAPS: 10.0000 mg | ORAL_CAPSULE | ORAL | Status: DC | PRN

## 2023-07-09 MED ORDER — TRANEXAMIC ACID-NACL 1000-0.7 MG/100ML-% IV SOLN
INTRAVENOUS | Status: AC
  Filled 2023-07-09: qty 100

## 2023-07-09 MED ORDER — KETOROLAC TROMETHAMINE 30 MG/ML IJ SOLN
INTRAMUSCULAR | Status: AC
  Filled 2023-07-09: qty 1

## 2023-07-09 MED ORDER — SUCCINYLCHOLINE CHLORIDE 200 MG/10ML IV SOSY
PREFILLED_SYRINGE | INTRAVENOUS | Status: DC | PRN
  Administered 2023-07-09: 100 mg via INTRAVENOUS

## 2023-07-09 MED ORDER — PROPOFOL 10 MG/ML IV BOLUS
INTRAVENOUS | Status: AC
  Filled 2023-07-09: qty 20

## 2023-07-09 MED ORDER — HYDROMORPHONE HCL 1 MG/ML IJ SOLN
INTRAMUSCULAR | Status: AC
  Filled 2023-07-09: qty 0.5

## 2023-07-09 MED ORDER — ACETAMINOPHEN 10 MG/ML IV SOLN
INTRAVENOUS | Status: AC
  Filled 2023-07-09: qty 100

## 2023-07-09 MED ORDER — WITCH HAZEL-GLYCERIN EX PADS: 1.0000 | MEDICATED_PAD | CUTANEOUS | Status: DC | PRN

## 2023-07-09 MED ORDER — LABETALOL HCL 5 MG/ML IV SOLN
20.0000 mg | INTRAVENOUS | Status: DC | PRN
Start: 2023-07-09 — End: 2023-07-09
  Administered 2023-07-09: 20 mg via INTRAVENOUS

## 2023-07-09 MED ORDER — STERILE WATER FOR IRRIGATION IR SOLN
Status: DC | PRN
  Administered 2023-07-09: 1000 mL

## 2023-07-09 MED ORDER — ACETAMINOPHEN 500 MG PO TABS
1000.0000 mg | ORAL_TABLET | Freq: Four times a day (QID) | ORAL | Status: DC
  Administered 2023-07-09 – 2023-07-12 (×11): 1000 mg via ORAL
  Filled 2023-07-09 (×12): qty 2

## 2023-07-09 MED ORDER — KETOROLAC TROMETHAMINE 30 MG/ML IJ SOLN: 30.0000 mg | Freq: Once | INTRAMUSCULAR | Status: DC | PRN

## 2023-07-09 MED ORDER — SOD CITRATE-CITRIC ACID 500-334 MG/5ML PO SOLN: 30.0000 mL | ORAL | Status: DC

## 2023-07-09 MED ORDER — ONDANSETRON HCL 4 MG/2ML IJ SOLN: 4.0000 mg | Freq: Once | INTRAMUSCULAR | Status: DC | PRN

## 2023-07-09 MED ORDER — SODIUM CHLORIDE 0.9 % IV SOLN
500.0000 mg | INTRAVENOUS | Status: AC
  Administered 2023-07-09: 500 mg via INTRAVENOUS

## 2023-07-09 MED ORDER — PROPOFOL 10 MG/ML IV BOLUS
INTRAVENOUS | Status: DC | PRN
  Administered 2023-07-09: 160 mg via INTRAVENOUS

## 2023-07-09 MED ORDER — DIBUCAINE (PERIANAL) 1 % EX OINT: 1.0000 | TOPICAL_OINTMENT | CUTANEOUS | Status: DC | PRN

## 2023-07-09 MED ORDER — PRENATAL MULTIVITAMIN CH
1.0000 | ORAL_TABLET | Freq: Every day | ORAL | Status: DC
  Administered 2023-07-10 – 2023-07-12 (×3): 1 via ORAL
  Filled 2023-07-09 (×3): qty 1

## 2023-07-09 MED ORDER — MIDAZOLAM HCL 2 MG/2ML IJ SOLN
INTRAMUSCULAR | Status: AC
  Filled 2023-07-09: qty 2

## 2023-07-09 MED ORDER — DEXAMETHASONE SODIUM PHOSPHATE 10 MG/ML IJ SOLN
INTRAMUSCULAR | Status: AC
  Filled 2023-07-09: qty 1

## 2023-07-09 MED ORDER — HYDROMORPHONE HCL 1 MG/ML IJ SOLN
0.2500 mg | INTRAMUSCULAR | Status: DC | PRN
  Administered 2023-07-09: 0.5 mg via INTRAVENOUS

## 2023-07-09 MED ORDER — SENNOSIDES-DOCUSATE SODIUM 8.6-50 MG PO TABS
2.0000 | ORAL_TABLET | Freq: Every day | ORAL | Status: DC
  Administered 2023-07-10 – 2023-07-12 (×3): 2 via ORAL
  Filled 2023-07-09 (×3): qty 2

## 2023-07-09 MED ORDER — SUCCINYLCHOLINE CHLORIDE 200 MG/10ML IV SOSY
PREFILLED_SYRINGE | INTRAVENOUS | Status: AC
  Filled 2023-07-09: qty 10

## 2023-07-09 MED ORDER — NIFEDIPINE 10 MG PO CAPS: 20.0000 mg | ORAL_CAPSULE | ORAL | Status: DC | PRN

## 2023-07-09 MED ORDER — TRIAMCINOLONE ACETONIDE 40 MG/ML IJ SUSP
INTRAMUSCULAR | Status: DC | PRN
  Administered 2023-07-09: 40 mg via INTRAMUSCULAR

## 2023-07-09 MED ORDER — OXYCODONE HCL 5 MG/5ML PO SOLN: 5.0000 mg | Freq: Once | ORAL | Status: DC | PRN

## 2023-07-09 MED ORDER — CEFAZOLIN SODIUM-DEXTROSE 2-4 GM/100ML-% IV SOLN: 2.0000 g | INTRAVENOUS | Status: DC

## 2023-07-09 MED ORDER — MAGNESIUM SULFATE BOLUS VIA INFUSION
4.0000 g | Freq: Once | INTRAVENOUS | Status: AC
  Administered 2023-07-09: 4 g via INTRAVENOUS
  Filled 2023-07-09: qty 1000

## 2023-07-09 MED ORDER — LABETALOL HCL 5 MG/ML IV SOLN: 40.0000 mg | INTRAVENOUS | Status: DC | PRN

## 2023-07-09 MED ORDER — MENTHOL 3 MG MT LOZG: 1.0000 | LOZENGE | OROMUCOSAL | Status: DC | PRN

## 2023-07-09 MED ORDER — KETOROLAC TROMETHAMINE 30 MG/ML IJ SOLN
30.0000 mg | Freq: Four times a day (QID) | INTRAMUSCULAR | Status: AC
  Administered 2023-07-09 – 2023-07-10 (×4): 30 mg via INTRAVENOUS
  Filled 2023-07-09 (×3): qty 1

## 2023-07-09 MED ORDER — FENTANYL CITRATE (PF) 100 MCG/2ML IJ SOLN
INTRAMUSCULAR | Status: DC | PRN
  Administered 2023-07-09: 100 ug via INTRAVENOUS
  Administered 2023-07-09 (×2): 50 ug via INTRAVENOUS
  Administered 2023-07-09: 150 ug via INTRAVENOUS
  Administered 2023-07-09: 50 ug via INTRAVENOUS

## 2023-07-09 MED ORDER — TETANUS-DIPHTH-ACELL PERTUSSIS 5-2.5-18.5 LF-MCG/0.5 IM SUSY: 0.5000 mL | PREFILLED_SYRINGE | Freq: Once | INTRAMUSCULAR | Status: DC

## 2023-07-09 MED ORDER — OXYTOCIN-SODIUM CHLORIDE 30-0.9 UT/500ML-% IV SOLN
INTRAVENOUS | Status: AC
  Filled 2023-07-09: qty 500

## 2023-07-09 MED ORDER — COCONUT OIL OIL: 1.0000 | TOPICAL_OIL | Status: DC | PRN

## 2023-07-09 MED ORDER — GABAPENTIN 100 MG PO CAPS
200.0000 mg | ORAL_CAPSULE | Freq: Every day | ORAL | Status: DC
  Administered 2023-07-09 – 2023-07-10 (×2): 200 mg via ORAL
  Filled 2023-07-09 (×4): qty 2

## 2023-07-09 MED ORDER — ENOXAPARIN SODIUM 60 MG/0.6ML IJ SOSY
50.0000 mg | PREFILLED_SYRINGE | INTRAMUSCULAR | Status: DC
  Administered 2023-07-09 – 2023-07-12 (×3): 50 mg via SUBCUTANEOUS
  Filled 2023-07-09 (×3): qty 0.6

## 2023-07-09 MED ORDER — DIPHENHYDRAMINE HCL 25 MG PO CAPS: 25.0000 mg | ORAL_CAPSULE | Freq: Four times a day (QID) | ORAL | Status: DC | PRN

## 2023-07-09 MED ORDER — MAGNESIUM HYDROXIDE 400 MG/5ML PO SUSP: 30.0000 mL | ORAL | Status: DC | PRN

## 2023-07-09 MED ORDER — IBUPROFEN 600 MG PO TABS
600.0000 mg | ORAL_TABLET | Freq: Four times a day (QID) | ORAL | Status: DC
  Administered 2023-07-10 – 2023-07-12 (×8): 600 mg via ORAL
  Filled 2023-07-09 (×8): qty 1

## 2023-07-09 MED ORDER — OXYTOCIN-SODIUM CHLORIDE 30-0.9 UT/500ML-% IV SOLN
INTRAVENOUS | Status: DC | PRN
  Administered 2023-07-09: 30 [IU] via INTRAVENOUS

## 2023-07-09 MED ORDER — LACTATED RINGERS IV BOLUS: 1000.0000 mL | Freq: Once | INTRAVENOUS | Status: DC

## 2023-07-09 MED ORDER — TRANEXAMIC ACID-NACL 1000-0.7 MG/100ML-% IV SOLN
1000.0000 mg | Freq: Once | INTRAVENOUS | Status: AC
  Administered 2023-07-09: 1000 mg via INTRAVENOUS

## 2023-07-09 MED ORDER — ONDANSETRON HCL 4 MG/2ML IJ SOLN
INTRAMUSCULAR | Status: AC
  Filled 2023-07-09: qty 2

## 2023-07-09 MED ORDER — DEXAMETHASONE SODIUM PHOSPHATE 10 MG/ML IJ SOLN
INTRAMUSCULAR | Status: DC | PRN
  Administered 2023-07-09: 10 mg via INTRAVENOUS

## 2023-07-09 MED ORDER — SODIUM CHLORIDE 0.9 % IR SOLN
Status: DC | PRN
  Administered 2023-07-09: 1

## 2023-07-09 MED ORDER — OXYTOCIN-SODIUM CHLORIDE 30-0.9 UT/500ML-% IV SOLN: 2.5000 [IU]/h | INTRAVENOUS | Status: AC

## 2023-07-09 MED ORDER — LACTATED RINGERS IV SOLN: INTRAVENOUS | Status: DC | PRN

## 2023-07-09 SURGICAL SUPPLY — 31 items
BENZOIN TINCTURE PRP APPL 2/3 (GAUZE/BANDAGES/DRESSINGS) ×1 IMPLANT
CHLORAPREP W/TINT 26 (MISCELLANEOUS) ×2 IMPLANT
CLAMP UMBILICAL CORD (MISCELLANEOUS) ×1 IMPLANT
CLOTH BEACON ORANGE TIMEOUT ST (SAFETY) ×1 IMPLANT
DERMABOND ADVANCED .7 DNX12 (GAUZE/BANDAGES/DRESSINGS) ×1 IMPLANT
DRSG OPSITE POSTOP 4X10 (GAUZE/BANDAGES/DRESSINGS) ×1 IMPLANT
ELECT REM PT RETURN 9FT ADLT (ELECTROSURGICAL) ×1
ELECTRODE REM PT RTRN 9FT ADLT (ELECTROSURGICAL) ×1 IMPLANT
EXTRACTOR VACUUM KIWI (MISCELLANEOUS) IMPLANT
GAUZE SPONGE 4X4 12PLY STRL LF (GAUZE/BANDAGES/DRESSINGS) IMPLANT
GLOVE BIOGEL PI IND STRL 7.0 (GLOVE) ×3 IMPLANT
GLOVE ECLIPSE 6.5 STRL STRAW (GLOVE) ×1 IMPLANT
GOWN STRL REUS W/TWL LRG LVL3 (GOWN DISPOSABLE) ×3 IMPLANT
KIT ABG SYR 3ML LUER SLIP (SYRINGE) IMPLANT
NDL HYPO 25X5/8 SAFETYGLIDE (NEEDLE) IMPLANT
NEEDLE HYPO 25X5/8 SAFETYGLIDE (NEEDLE) IMPLANT
NS IRRIG 1000ML POUR BTL (IV SOLUTION) ×1 IMPLANT
PACK C SECTION WH (CUSTOM PROCEDURE TRAY) ×1 IMPLANT
PAD ABD 7.5X8 STRL (GAUZE/BANDAGES/DRESSINGS) ×1 IMPLANT
PAD OB MATERNITY 4.3X12.25 (PERSONAL CARE ITEMS) ×1 IMPLANT
RTRCTR C-SECT PINK 25CM LRG (MISCELLANEOUS) ×1 IMPLANT
SUT PLAIN 0 NONE (SUTURE) IMPLANT
SUT PLAIN 2 0 XLH (SUTURE) IMPLANT
SUT VIC AB 0 CT1 27XBRD ANBCTR (SUTURE) ×2 IMPLANT
SUT VIC AB 0 CTX36XBRD ANBCTRL (SUTURE) ×3 IMPLANT
SUT VIC AB 2-0 CT1 TAPERPNT 27 (SUTURE) ×1 IMPLANT
SUT VIC AB 3-0 SH 27XBRD (SUTURE) ×1 IMPLANT
SUT VIC AB 4-0 KS 27 (SUTURE) ×1 IMPLANT
TOWEL OR 17X24 6PK STRL BLUE (TOWEL DISPOSABLE) ×1 IMPLANT
TRAY FOLEY W/BAG SLVR 14FR LF (SET/KITS/TRAYS/PACK) IMPLANT
WATER STERILE IRR 1000ML POUR (IV SOLUTION) ×1 IMPLANT

## 2023-07-09 NOTE — Progress Notes (Signed)
Video interpreter used during entire PACU stay

## 2023-07-09 NOTE — Op Note (Cosign Needed Addendum)
Victoria Conner PROCEDURE DATE: 07/09/2023  PREOPERATIVE DIAGNOSES: Intrauterine pregnancy at [redacted]w[redacted]d weeks gestation; malpresentation: footling breach and previous uterine incision low transverse  POSTOPERATIVE DIAGNOSES: The same  PROCEDURE: Repeat Low Transverse Cesarean Section  SURGEON:  Dr. Myna Hidalgo  ASSISTANT:  Dr. Wyn Forster An experienced assistant was required given the standard of surgical care given the complexity of the case.  This assistant was needed for exposure, dissection, suctioning, retraction, instrument exchange, and for overall help during the procedure.  ANESTHESIOLOGY TEAM: Anesthesiologist: Trevor Iha, MD CRNA: Renford Dills, CRNA  INDICATIONS: Victoria Conner is a 28 y.o. G2P1001 at [redacted]w[redacted]d here for cesarean section secondary to the indications listed under preoperative diagnoses; please see preoperative note for further details.  The risks of surgery were discussed with the patient including but were not limited to: bleeding which may require transfusion or reoperation; infection which may require antibiotics; injury to bowel, bladder, ureters or other surrounding organs; injury to the fetus; need for additional procedures including hysterectomy in the event of a life-threatening hemorrhage; formation of adhesions; placental abnormalities wth subsequent pregnancies; incisional problems; thromboembolic phenomenon and other postoperative/anesthesia complications.  The patient concurred with the proposed plan, giving informed written consent for the procedure.    FINDINGS:  Viable female infant in cephalic presentation.  Apgars 4 and 9.  Meconium stained amniotic fluid.  Intact placenta, three vessel cord.  Normal uterus, fallopian tubes and ovaries bilaterally.  ANESTHESIA: General INTRAVENOUS FLUIDS: 2000 ml   ESTIMATED BLOOD LOSS: 713 ml URINE OUTPUT:  25 ml SPECIMENS: Placenta sent to L&D COMPLICATIONS: None immediate  PROCEDURE IN DETAIL:  The  patient preoperatively received intravenous antibiotics and had sequential compression devices applied to her lower extremities.  She was then taken to the operating room where general anesthesia was induced. She was then placed in a dorsal supine position with a leftward tilt, and prepped and draped in a sterile manner.  A foley catheter was placed into her bladder and attached to constant gravity.    After an adequate timeout was performed, a Pfannenstiel skin incision was made with scalpel on her preexisting scar and carried through to the underlying layer of fascia. The fascia was incised in the midline, and this incision was extended bilaterally using the Mayo scissors.  Kocher clamps were applied to the superior aspect of the fascial incision and the underlying rectus muscles were dissected off bluntly and sharply.  A similar process was carried out on the inferior aspect of the fascial incision. The rectus muscles were separated in the midline and the peritoneum was entered bluntly. The Alexis self-retaining retractor was introduced into the abdominal cavity.  Bladder flap was created and extended sharply with Metzenbaum. Attention was turned to the lower uterine segment where a low transverse hysterotomy was made with a scalpel and extended bilaterally bluntly.  The infant was successfully delivered, the cord was clamped and cut after one minute, and the infant was handed over to the awaiting neonatology team. Uterine massage was then administered, and the placenta delivered intact with a three-vessel cord. The uterus was then cleared of clots and debris.    The hysterotomy was closed with 0 Vicryl in a running locked fashion, and an imbricating layer was also placed with 0 Vicryl. Figure-of-eight 0 Vicryl serosal stitches were placed to help with hemostasis. The pelvis was cleared of all clot and debris. Arista powder was sprinkled over the hysterotomy and bladder flap to assist with hemostasis. The  retractor was removed.  The rectus were re-approximated with interrupted 2-0 Vicryl stitches. The fascia was then closed using 0 Vicryl in a running fashion from both sides.  The subcutaneous layer was irrigated and everything was hemostatic  and due to depth of tissue, it was re-approximated with 2-0 plain gut in a running fashion. The skin was closed with a 4-0 Vicryl subcuticular stitch. The patient tolerated the procedure well. Sponge, instrument and needle counts were correct x 3.  She was taken to the recovery room in stable condition.   Wyn Forster, MD FMOB Fellow, Faculty practice [facility], Center for [facility]

## 2023-07-09 NOTE — MAU Note (Signed)
..  Victoria Conner is a 28 y.o. at [redacted]w[redacted]d here in MAU reporting: SROM light-moderate meconium at 0500 this morning, states she is contracting every 1-2 minutes. Denies VB. +FM.   Patient pulled directly to the room due to discomfort and leaking fluid off of the wheelchair.   Provider called to evaluate patient.   Pain score: 10 Vitals:   07/09/23 0735 07/09/23 0736  BP:  (!) 148/82  Pulse:  (!) 112  Resp:    Temp:    SpO2: 100% 100%     FHT:162

## 2023-07-09 NOTE — H&P (Addendum)
Obstetric Preoperative History and Physical  Victoria Conner is a 28 y.o. G2P1001 with IUP at [redacted]w[redacted]d presenting due to ruptured membranes this am around 5am with painful contractions.  Pt acutely uncomfortable due to painful contractions.  Swahili interpreter used for the entire visit including consent  Cesarean Section Indication:  prior C_section, Breech presentation  Prenatal Course Source of Care:   Initial visit at 27 weeks- [facility]  Pregnancy complications or risks: Patient Active Problem List   Diagnosis Date Noted   Intrauterine normal pregnancy 07/09/2023   Asymptomatic bacteriuria during pregnancy 06/08/2023   Supervision of other normal pregnancy, antepartum 06/02/2023   Previous cesarean delivery affecting pregnancy, antepartum 05/09/2023   History of gestational diabetes in prior pregnancy, currently pregnant 05/09/2023   Hx of Gestational hypertension 08/19/2021   Language barrier affecting health care 05/28/2021   Abnormal chromosomal and genetic finding on antenatal screening mother-^ risk SMA 05/28/2021   Anemia affecting pregnancy 05/14/2021   Prenatal labs and studies: ABO, Rh: AB/Positive/-- (12/12 1141) Antibody: Negative (12/12 1141) Rubella: 5.42 (12/12 1141) RPR: Non Reactive (12/18 0857)  HBsAg: Negative (12/12 1141)  HIV: Non Reactive (12/18 0857)  GBS:  1 hr Glucola  normal Genetic screening  increased risk SMA [place] not completed  Prenatal Transfer Tool  Maternal Diabetes: No Genetic Screening: Normal Maternal Ultrasounds/Referrals: not completed Fetal Ultrasounds or other Referrals:  None Maternal Substance Abuse:  No Significant Maternal Medications:  None Significant Maternal Lab Results: Other:  GBS unknown  Past Medical History:  Diagnosis Date   Abnormal chromosomal and genetic finding on antenatal screening mother 05/28/2021   Breech presentation, single or unspecified fetus 08/19/2021   Gestational hypertension 08/19/2021   Malpresentation  before onset of labor 08/19/2021   Medical history non-contributory    S/P primary low transverse C-section 08/19/2021   Uterine size date discrepancy pregnancy, second trimester 05/28/2021    Past Surgical History:  Procedure Laterality Date   CESAREAN SECTION  08/19/2021   Procedure: CESAREAN SECTION;  Surgeon: Federico Flake, MD;  Location: [facility] LD [facility]S;  Service: Obstetrics;;   NO PAST SURGERIES      OB History  Gravida Para Term Preterm AB Living  2 1 1  0 0 1  SAB IAB Ectopic Multiple Live Births  0 0 0 0 1    # Outcome Date GA Lbr Len/2nd Weight Sex Type Anes PTL Lv  2 Current           1 Term 08/19/21 [redacted]w[redacted]d  2560 g M CS-LTranv Spinal  LIV    Social History   Socioeconomic History   Marital status: Married    Spouse name: Loss adjuster, chartered   Number of children: Not on file   Years of education: Not on file   Highest education level: 8th grade  Occupational History   Occupation: UNEMPLOYED  Tobacco Use   Smoking status: Never    Passive exposure: Never   Smokeless tobacco: Never  Vaping Use   Vaping status: Never Used  Substance and Sexual Activity   Alcohol use: Never   Drug use: Never   Sexual activity: Yes  Other Topics Concern   Not on file  Social History Narrative   Not on file   Social Drivers of Health   Financial Resource Strain: Not on file  Food Insecurity: No Food Insecurity (07/29/2021)   Hunger Vital Sign    Worried About Running Out of Food in the Last Year: Never true    Ran Out of Food in the  Last Year: Never true  Transportation Needs: No Transportation Needs (07/29/2021)   PRAPARE - Administrator, Civil Service (Medical): No    Lack of Transportation (Non-Medical): No  Physical Activity: Not on file  Stress: Not on file  Social Connections: Not on file    Family History  Problem Relation Age of Onset   Hypertension Mother     Medications Prior to Admission  Medication Sig Dispense Refill Last Dose/Taking   aspirin EC 81  MG tablet Take 2 tablets (162 mg total) by mouth daily. Start taking when you are [redacted] weeks pregnant for rest of pregnancy for prevention of preeclampsia 300 tablet 2    cefadroxil (DURICEF) 500 MG capsule Take 1 capsule (500 mg total) by mouth 2 (two) times daily. 14 capsule 0    Prenat-Fe Poly-Methfol-FA-DHA (VITAFOL ULTRA) 29-0.6-0.4-200 MG CAPS Take 1 capsule by mouth daily. 30 capsule 12     No Known Allergies  Review of Systems: Pertinent items noted in HPI and remainder of comprehensive ROS otherwise negative.  Physical Exam: BP (!) 127/108 (BP Location: Right Arm)   Pulse (!) 107   Temp 98 F (36.7 C) (Oral)   Resp 18   LMP 11/30/2022 (Exact Date)   SpO2 100%  FHR by Doppler: 170 bpm, moderate variability, occasional variable decels, no accels CONSTITUTIONAL: acute uncomfortable  HENT:  Normocephalic, atraumatic, External right and left ear normal. Oropharynx is clear and moist EYES: Conjunctivae and EOM are normal. Pupils are equal, round, and reactive to light. No scleral icterus.  NECK: Normal range of motion, supple, no masses SKIN: Skin is warm and dry.  NEUROLGIC: Alert and oriented to person, place, and time.  PSYCHIATRIC: Normal mood and affect. Normal behavior. Normal judgment and thought content. CARDIOVASCULAR: Normal heart rate noted, regular rhythm RESPIRATORY: Effort and breath sounds normal, no problems with respiration noted ABDOMEN: Gravid, well healed PELVIC: Deferred MUSCULOSKELETAL: Normal range of motion. No edema and no tenderness. 2+ distal pulses.  BSUS: breech presentation SVE: difficulty with exam- suspect complete, possible leg presenting  Pertinent Labs/Studies:   No results found for this or any previous visit (from the past 72 hours).  Assessment and Plan: Victoria Conner is a 28 y.o. G2P1001 at [redacted]w[redacted]d in active labor with prior C-section and breech presentation -IV started -NPO -LR @ 125cc/hr -OR notified plan for repeat C-section -IV  Ancef and Azithromycin ordered The risks of surgery were discussed with the patient including but were not limited to: bleeding which may require transfusion or reoperation; infection which may require antibiotics; injury to bowel, bladder, ureters or other surrounding organs; injury to the fetus; need for additional procedures.  The patient concurred with the proposed plan, giving informed written consent for the procedure.  Anesthesia and OR aware. Preoperative prophylactic antibiotics and SCDs ordered on call to the OR. To OR when ready.   Katha Hamming, DO Obstetrician & Gynecologist, [facility] for [employer], [facility]

## 2023-07-09 NOTE — Anesthesia Postprocedure Evaluation (Signed)
Anesthesia Post Note  Patient: Occupational psychologist  Procedure(s) Performed: CESAREAN SECTION     Patient location during evaluation: PACU Anesthesia Type: General Level of consciousness: awake and alert Pain management: pain level controlled Vital Signs Assessment: post-procedure vital signs reviewed and stable Respiratory status: spontaneous breathing, nonlabored ventilation, respiratory function stable and patient connected to nasal cannula oxygen Cardiovascular status: blood pressure returned to baseline and stable Postop Assessment: no apparent nausea or vomiting Anesthetic complications: no   No notable events documented.  Last Vitals:  Vitals:   07/09/23 1155 07/09/23 1259  BP: (!) 143/92 (!) 144/88  Pulse: 80 87  Resp: 18 14  Temp: 36.9 C   SpO2: 96%     Last Pain:  Vitals:   07/09/23 1155  TempSrc: Oral  PainSc: 0-No pain   Pain Goal:                   Trevor Iha

## 2023-07-09 NOTE — Transfer of Care (Signed)
Immediate Anesthesia Transfer of Care Note  Patient: Victoria Conner  Procedure(s) Performed: CESAREAN SECTION  Patient Location: PACU  Anesthesia Type:General  Level of Consciousness: drowsy  Airway & Oxygen Therapy: Patient Spontanous Breathing and Patient connected to nasal cannula oxygen  Post-op Assessment: Report given to RN and Post -op Vital signs reviewed and stable  Post vital signs: Reviewed and stable  Last Vitals:  Vitals Value Taken Time  BP 118/67 07/09/23 0915  Temp 36.8 C 07/09/23 0915  Pulse 121 07/09/23 0920  Resp 18 07/09/23 0920  SpO2 100 % 07/09/23 0920  Vitals shown include unfiled device data.  Last Pain:  Vitals:   07/09/23 0732  TempSrc: Oral         Complications: No notable events documented.

## 2023-07-09 NOTE — Anesthesia Procedure Notes (Addendum)
Procedure Name: Intubation Date/Time: 07/09/2023 8:02 AM  Performed by: Renford Dills, CRNAPre-anesthesia Checklist: Patient identified, Patient being monitored, Timeout performed, Emergency Drugs available and Suction available Patient Re-evaluated:Patient Re-evaluated prior to induction Oxygen Delivery Method: Circle System Utilized Preoxygenation: Pre-oxygenation with 100% oxygen Induction Type: IV induction, Rapid sequence and Cricoid Pressure applied Laryngoscope Size: 3 and Glidescope Grade View: Grade I Tube type: Oral Tube size: 7.0 mm Number of attempts: 1 Airway Equipment and Method: stylet Placement Confirmation: ETT inserted through vocal cords under direct vision, positive ETCO2 and breath sounds checked- equal and bilateral Secured at: 21 cm Tube secured with: Tape Dental Injury: Teeth and Oropharynx as per pre-operative assessment

## 2023-07-09 NOTE — Discharge Summary (Signed)
Postpartum Discharge Summary  Patient Name: Victoria Conner DOB: August 11, 1995 MRN: [id number]  Date of admission: 07/09/2023 Delivery date:07/09/2023 Delivering provider: Myna Hidalgo Date of discharge: 07/12/2023  Admitting diagnosis: Intrauterine normal pregnancy [Z34.90] Intrauterine pregnancy: [redacted]w[redacted]d     Secondary diagnosis:  Active Problems:   S/P cesarean section  Additional problems: Prior C-section Breech presentation Scant prenatal care    Discharge diagnosis: Term Pregnancy Delivered                                              Post partum procedures:blood transfusion - 2u Augmentation: N/A Complications: None  Hospital course: Onset of Labor With Unplanned C/S   28 y.o. yo [id number] at [redacted]w[redacted]d was admitted in Active Labor on 07/09/2023. She underwent repeat cesarean section for breech. Delivery details as follows: Membrane Rupture Time/Date: 5:00 AM,07/09/2023  Delivery Method:C-Section, Low Transverse Operative Delivery:N/A Details of operation can be found in separate operative note. Patient had a postpartum course complicated by post operative blood loss anemia requiring transfusion of 2u pRBCs. She also had preeclampsia with severe features. She received magnesium x 24h. BP stabilized on procardia XL 60mg  BID and lasix.  She is ambulating,tolerating a regular diet, passing flatus, and urinating well.  Patient is discharged home in stable condition 07/12/23.  Newborn Data: Birth date:07/09/2023 Birth time:8:07 AM Gender:Female Living status:Living Apgars:4 ,9  Weight:3260 g  Magnesium Sulfate received: Yes: Seizure prophylaxis BMZ received: No Rhophylac:N/A MMR:N/A T-DaP: No Flu: No RSV Vaccine received: No Transfusion:Yes  Immunizations received: Immunization History  Administered Date(s) Administered   Tdap 07/22/2021    Physical exam  Vitals:   07/11/23 1618 07/11/23 2021 07/12/23 0033 07/12/23 0348  BP: 139/79 (!) 147/90 (!) 150/91 130/83  Pulse: 82  (!) 117 88 81  Resp: 18 18 17 18   Temp: 98.4 F (36.9 C) 98.9 F (37.2 C) 98.5 F (36.9 C) 98.3 F (36.8 C)  TempSrc: Oral Oral Oral Oral  SpO2: 98% 99% 100%    General: alert, cooperative, and no distress Lochia: appropriate Uterine Fundus: firm Incision: Dressing is clean, dry, and intact DVT Evaluation: No evidence of DVT seen on physical exam. Labs: Lab Results  Component Value Date   WBC 22.3 (H) 07/10/2023   HGB 8.3 (L) 07/10/2023   HCT 28.3 (L) 07/10/2023   MCV 72.9 (L) 07/10/2023   PLT 168 07/10/2023      Latest Ref Rng & Units 07/09/2023    6:46 PM  CMP  Creatinine 0.44 - 1.00 mg/dL 9.52    Edinburgh Score:    07/10/2023    3:00 PM  Edinburgh Postnatal Depression Scale Screening Tool  I have been able to laugh and see the funny side of things. 0  I have looked forward with enjoyment to things. 0  I have blamed myself unnecessarily when things went wrong. 0  I have been anxious or worried for no good reason. 0  I have felt scared or panicky for no good reason. 0  Things have been getting on top of me. 0  I have been so unhappy that I have had difficulty sleeping. 0  I have felt sad or miserable. 0  I have been so unhappy that I have been crying. 0  The thought of harming myself has occurred to me. 0  Edinburgh Postnatal Depression Scale Total 0   No data recorded  After visit meds:  Allergies as of 07/12/2023   No Known Allergies      Medication List     STOP taking these medications    aspirin EC 81 MG tablet   cefadroxil 500 MG capsule Commonly known as: DURICEF       TAKE these medications    acetaminophen 500 MG tablet Commonly known as: TYLENOL Take 2 tablets (1,000 mg total) by mouth every 6 (six) hours.   furosemide 20 MG tablet Commonly known as: LASIX Take 1 tablet (20 mg total) by mouth daily.   ibuprofen 600 MG tablet Commonly known as: ADVIL Take 1 tablet (600 mg total) by mouth every 6 (six) hours.   NIFEdipine 60 MG  24 hr tablet Commonly known as: ADALAT CC Take 1 tablet (60 mg total) by mouth in the morning and at bedtime.   oxyCODONE 5 MG immediate release tablet Commonly known as: Oxy IR/ROXICODONE Take 1-2 tablets (5-10 mg total) by mouth every 4 (four) hours as needed for breakthrough pain.   senna-docusate 8.6-50 MG tablet Commonly known as: Senokot-S Take 2 tablets by mouth at bedtime as needed for mild constipation.   Vitafol Ultra 29-0.6-0.4-200 MG Caps Take 1 capsule by mouth daily.               Discharge Care Instructions  (From admission, onward)           Start     Ordered   07/12/23 0000  Discharge wound care:       Comments: Remove clear/white honeycomb dressing on 1/23   07/12/23 0801             Discharge home in stable condition Infant Feeding: Bottle and Breast Infant Disposition: NICU Discharge instruction: per After Visit Summary and Postpartum booklet. Activity: Advance as tolerated. Pelvic rest for 6 weeks.  Diet: routine diet Future Appointments: Future Appointments  Date Time Provider Department Center  07/13/2023 11:15 AM Celedonio Savage, MD [facility]  07/18/2023  8:45 AM [facility]-CHAIR 6 [facility] None   Follow up Visit:  Follow-up Information     [facility] Follow up in 1 week(s).   Specialty: Obstetrics and Gynecology Why: For blood pressure check Contact information: [address] [place] [address] [phone]        [facility] Follow up in 4 week(s).   Specialty: Obstetrics and Gynecology Why: For routine postpartum care Contact information: [address] [facility] [employer] [place] [address] [phone]                 Please schedule this patient for a In person postpartum visit in 1 week with the following provider: Any provider. Additional Postpartum F/U:Incision check 1 week  Low  risk pregnancy complicated by:  scant [facility] Delivery mode:  C-Section, Low Transverse Anticipated Birth Control:   declined   07/12/2023 Lennart Pall, MD

## 2023-07-09 NOTE — Lactation Note (Signed)
This note was copied from a baby's chart. Lactation Consultation Note  Patient Name: Victoria Conner'V Date: 07/09/2023 Age:28 years Reason for consult: Initial assessment;Early term 37-38.6wks Telephone interpreter used to communicate with MOB in Swahili.  P2- Chart states that infant is [redacted]w[redacted]d, but Babs Sciara (previous LC) informed this LC that she was told that they actually suspect the infant to be around 39 w. MOB was flat the entire time she was talking to [facility]. MOB answered primarily in one word sentences. MOB states that she plans to feed infant both breast and formula like she did with her older child. MOB also states that she feels confident with breastfeeding and would not like for lactation team to follow up with her. MOB does not qualify for a Stork pump, but she is already associated with WIC. A WIC referral has been sent.  LC reviewed feeding infant on cue 8-12x in 24 hrs, not allowing infant to go over 3 hrs without a feeding, CDC milk storage guidelines and LC services handout. LC encouraged MOB to call for further assistance as needed.  Maternal Data Has patient been taught Hand Expression?: No Does the patient have breastfeeding experience prior to this delivery?: Yes  Feeding Mother's Current Feeding Choice: Breast Milk and Formula Nipple Type: Slow - flow  Lactation Tools Discussed/Used Pump Education: Milk Storage  Interventions Interventions: Breast feeding basics reviewed;Education;LC Services brochure  Discharge Discharge Education: Engorgement and breast care;Warning signs for feeding baby WIC Program: Yes  Consult Status Consult Status: Complete (mother declined follow up) Date: 07/09/23    Dema Severin BS, IBCLC 07/09/2023, 5:46 PM

## 2023-07-09 NOTE — Anesthesia Preprocedure Evaluation (Signed)
Anesthesia Evaluation  Patient identified by MRN, date of birth, ID band Patient awake    Reviewed: Allergy & Precautions, NPO status , Unable to perform ROS - Chart review onlyPreop documentation limited or incomplete due to emergent nature of procedure.  Airway Mallampati: III  TM Distance: >3 FB Neck ROM: Full    Dental no notable dental hx. (+) Teeth Intact, Dental Advisory Given   Pulmonary    Pulmonary exam normal breath sounds clear to auscultation       Cardiovascular hypertension, Normal cardiovascular exam Rhythm:Regular Rate:Normal     Neuro/Psych    GI/Hepatic   Endo/Other    Renal/GU      Musculoskeletal   Abdominal   Peds  Hematology   Anesthesia Other Findings   Reproductive/Obstetrics (+) Pregnancy                             Anesthesia Physical Anesthesia Plan  ASA: 3 and emergent  Anesthesia Plan: General   Post-op Pain Management:    Induction: Intravenous  PONV Risk Score and Plan: Treatment may vary due to age or medical condition  Airway Management Planned: Oral ETT  Additional Equipment: None  Intra-op Plan:   Post-operative Plan: Extubation in OR  Informed Consent: I have reviewed the patients History and Physical, chart, labs and discussed the procedure including the risks, benefits and alternatives for the proposed anesthesia with the patient or authorized representative who has indicated his/her understanding and acceptance.     Interpreter used for interview and Sales promotion account executive given  Plan Discussed with:   Anesthesia Plan Comments: (Pt hx taken w video interpreter)       Anesthesia Quick Evaluation

## 2023-07-10 LAB — CBC
HCT: 20.9 % — ABNORMAL LOW (ref 36.0–46.0)
Hemoglobin: 5.8 g/dL — CL (ref 12.0–15.0)
MCH: 19.7 pg — ABNORMAL LOW (ref 26.0–34.0)
MCHC: 27.8 g/dL — ABNORMAL LOW (ref 30.0–36.0)
MCV: 70.8 fL — ABNORMAL LOW (ref 80.0–100.0)
Platelets: 200 10*3/uL (ref 150–400)
RBC: 2.95 MIL/uL — ABNORMAL LOW (ref 3.87–5.11)
RDW: 25.9 % — ABNORMAL HIGH (ref 11.5–15.5)
WBC: 29.2 10*3/uL — ABNORMAL HIGH (ref 4.0–10.5)
nRBC: 2.9 % — ABNORMAL HIGH (ref 0.0–0.2)

## 2023-07-10 LAB — PREPARE RBC (CROSSMATCH)

## 2023-07-10 MED ORDER — NIFEDIPINE ER OSMOTIC RELEASE 30 MG PO TB24
30.0000 mg | ORAL_TABLET | Freq: Every day | ORAL | Status: DC
  Administered 2023-07-10: 30 mg via ORAL
  Filled 2023-07-10: qty 1

## 2023-07-10 MED ORDER — SODIUM CHLORIDE 0.9% IV SOLUTION: Freq: Once | INTRAVENOUS | Status: AC

## 2023-07-10 MED ORDER — LABETALOL HCL 5 MG/ML IV SOLN: 80.0000 mg | INTRAVENOUS | Status: DC | PRN

## 2023-07-10 MED ORDER — NIFEDIPINE ER OSMOTIC RELEASE 60 MG PO TB24
60.0000 mg | ORAL_TABLET | Freq: Every day | ORAL | Status: DC
Start: 2023-07-11 — End: 2023-07-10

## 2023-07-10 MED ORDER — HYDRALAZINE HCL 20 MG/ML IJ SOLN: 10.0000 mg | INTRAMUSCULAR | Status: DC | PRN

## 2023-07-10 MED ORDER — FUROSEMIDE 20 MG PO TABS
20.0000 mg | ORAL_TABLET | Freq: Every day | ORAL | Status: DC
  Administered 2023-07-10 – 2023-07-12 (×3): 20 mg via ORAL
  Filled 2023-07-10 (×4): qty 1

## 2023-07-10 MED ORDER — NIFEDIPINE ER OSMOTIC RELEASE 30 MG PO TB24
30.0000 mg | ORAL_TABLET | ORAL | Status: AC
  Administered 2023-07-10: 30 mg via ORAL
  Filled 2023-07-10: qty 1

## 2023-07-10 MED ORDER — NIFEDIPINE ER OSMOTIC RELEASE 60 MG PO TB24: 60.0000 mg | ORAL_TABLET | Freq: Every day | ORAL | Status: DC

## 2023-07-10 MED ORDER — NIFEDIPINE ER OSMOTIC RELEASE 30 MG PO TB24: 30.0000 mg | ORAL_TABLET | Freq: Every day | ORAL | Status: DC

## 2023-07-10 MED ORDER — LABETALOL HCL 5 MG/ML IV SOLN
20.0000 mg | INTRAVENOUS | Status: DC | PRN
  Administered 2023-07-10: 20 mg via INTRAVENOUS
  Filled 2023-07-10: qty 4

## 2023-07-10 MED ORDER — LABETALOL HCL 5 MG/ML IV SOLN
40.0000 mg | INTRAVENOUS | Status: DC | PRN
  Administered 2023-07-10: 40 mg via INTRAVENOUS
  Filled 2023-07-10: qty 8

## 2023-07-10 NOTE — Progress Notes (Signed)
Attempted to ambulate patient on 2 separate occasions today. BP elevations in the severe range noted in both instances upon standing. Patient returned to lying position and treated with Labetalol (See EMAR). Foley catheter in place. Foley care/peri care done. Full linen change. Small abrasion noted on right anterior arm. Patient states this happened when she pulled the tape off.

## 2023-07-10 NOTE — Plan of Care (Signed)
  Problem: Education: Goal: Knowledge of General Education information will improve Description: Including pain rating scale, medication(s)/side effects and non-pharmacologic comfort measures Outcome: Progressing   Problem: Health Behavior/Discharge Planning: Goal: Ability to manage health-related needs will improve Outcome: Progressing   Problem: Clinical Measurements: Goal: Ability to maintain clinical measurements within normal limits will improve Outcome: Progressing Goal: Will remain free from infection Outcome: Progressing Goal: Diagnostic test results will improve Outcome: Progressing Goal: Respiratory complications will improve Outcome: Progressing Goal: Cardiovascular complication will be avoided Outcome: Progressing   Problem: Activity: Goal: Risk for activity intolerance will decrease Outcome: Progressing   Problem: Nutrition: Goal: Adequate nutrition will be maintained Outcome: Progressing   Problem: Coping: Goal: Level of anxiety will decrease Outcome: Progressing   Problem: Elimination: Goal: Will not experience complications related to bowel motility Outcome: Progressing Goal: Will not experience complications related to urinary retention Outcome: Progressing   Problem: Pain Managment: Goal: General experience of comfort will improve and/or be controlled Outcome: Progressing   Problem: Safety: Goal: Ability to remain free from injury will improve Outcome: Progressing   Problem: Skin Integrity: Goal: Risk for impaired skin integrity will decrease Outcome: Progressing   Problem: Education: Goal: Knowledge of disease or condition will improve Outcome: Progressing Goal: Knowledge of the prescribed therapeutic regimen will improve Outcome: Progressing   Problem: Fluid Volume: Goal: Peripheral tissue perfusion will improve Outcome: Progressing   Problem: Clinical Measurements: Goal: Complications related to disease process, condition or treatment  will be avoided or minimized Outcome: Progressing   Problem: Education: Goal: Knowledge of the prescribed therapeutic regimen will improve Outcome: Progressing Goal: Understanding of sexual limitations or changes related to disease process or condition will improve Outcome: Progressing Goal: Individualized Educational Video(s) Outcome: Progressing   Problem: Self-Concept: Goal: Communication of feelings regarding changes in body function or appearance will improve Outcome: Progressing   Problem: Skin Integrity: Goal: Demonstration of wound healing without infection will improve Outcome: Progressing   Problem: Education: Goal: Knowledge of condition will improve Outcome: Progressing Goal: Individualized Educational Video(s) Outcome: Progressing Goal: Individualized Newborn Educational Video(s) Outcome: Progressing   Problem: Activity: Goal: Will verbalize the importance of balancing activity with adequate rest periods Outcome: Progressing Goal: Ability to tolerate increased activity will improve Outcome: Progressing   Problem: Coping: Goal: Ability to identify and utilize available resources and services will improve Outcome: Progressing   Problem: Life Cycle: Goal: Chance of risk for complications during the postpartum period will decrease Outcome: Progressing   Problem: Role Relationship: Goal: Ability to demonstrate positive interaction with newborn will improve Outcome: Progressing   Problem: Skin Integrity: Goal: Demonstration of wound healing without infection will improve Outcome: Progressing

## 2023-07-10 NOTE — Progress Notes (Signed)
POSTPARTUM PROGRESS NOTE  POD #1  Subjective:  Victoria Conner is a 28 y.o. [id number] s/p rLTCS at [redacted]w[redacted]d. Today she notes she is doing ok, reports no acute complaints. Tolerating po without difficulty.  Minimal ambulation due to elevated BP and currently on Mag.  She has passed flatus, no BM.  Pain is well controlled.  Lochia minimal Denies fever/chills/chest pain/SOB.    Swahili interpreter used during visit  Objective: Blood pressure 129/77, pulse 95, temperature 98.2 F (36.8 C), temperature source Oral, resp. rate 18, last menstrual period 11/30/2022, SpO2 95%, unknown if currently breastfeeding.  UOP:1025cc/8hr  Physical Exam:  General: alert, cooperative and no distress Chest: no respiratory distress, ctab Heart: regular rate and rhythm Abdomen: obese, soft, nontender, +BS Uterine Fundus: firm, below umbilicus, non-tender Incision: honeycomb in place- clean and dry DVT Evaluation: No calf swelling or tenderness Extremities: minimal edema, SCDs in place Skin: warm, dry  Results for orders placed or performed during the hospital encounter of 07/09/23 (from the past 24 hours)  Type and screen [facility]     Status: None   Collection Time: 07/09/23  7:33 AM  Result Value Ref Range   ABO/RH(D) AB POS    Antibody Screen NEG    Sample Expiration      07/12/2023,2359 Performed at [facility], [address], [place], [place] [address]   CBC     Status: Abnormal   Collection Time: 07/09/23  7:35 AM  Result Value Ref Range   WBC 14.4 (H) 4.0 - 10.5 K/uL   RBC 4.18 3.87 - 5.11 MIL/uL   Hemoglobin 8.0 (L) 12.0 - 15.0 g/dL   HCT 81.1 (L) 91.4 - 78.2 %   MCV 68.2 (L) 80.0 - 100.0 fL   MCH 19.1 (L) 26.0 - 34.0 pg   MCHC 28.1 (L) 30.0 - 36.0 g/dL   RDW 95.6 (H) 21.3 - 08.6 %   Platelets 214 150 - 400 K/uL   nRBC 2.8 (H) 0.0 - 0.2 %  RPR     Status: None   Collection Time: 07/09/23  7:35 AM  Result Value Ref Range   RPR Ser Ql NON REACTIVE NON  REACTIVE  Comprehensive metabolic panel     Status: Abnormal   Collection Time: 07/09/23  7:35 AM  Result Value Ref Range   Sodium 133 (L) 135 - 145 mmol/L   Potassium 3.8 3.5 - 5.1 mmol/L   Chloride 102 98 - 111 mmol/L   CO2 17 (L) 22 - 32 mmol/L   Glucose, Bld 102 (H) 70 - 99 mg/dL   BUN 6 6 - 20 mg/dL   Creatinine, Ser 5.78 0.44 - 1.00 mg/dL   Calcium 9.0 8.9 - 46.9 mg/dL   Total Protein 6.9 6.5 - 8.1 g/dL   Albumin 2.6 (L) 3.5 - 5.0 g/dL   AST 23 15 - 41 U/L   ALT 11 0 - 44 U/L   Alkaline Phosphatase 155 (H) 38 - 126 U/L   Total Bilirubin 1.9 (H) 0.0 - 1.2 mg/dL   GFR, Estimated >62 >95 mL/min   Anion gap 14 5 - 15  Creatinine, serum     Status: None   Collection Time: 07/09/23  6:46 PM  Result Value Ref Range   Creatinine, Ser 0.77 0.44 - 1.00 mg/dL   GFR, Estimated >28 >41 mL/min  CBC     Status: Abnormal   Collection Time: 07/10/23  4:14 AM  Result Value Ref Range   WBC  29.2 (H) 4.0 - 10.5 K/uL   RBC 2.95 (L) 3.87 - 5.11 MIL/uL   Hemoglobin 5.8 (LL) 12.0 - 15.0 g/dL   HCT 22.0 (L) 25.4 - 27.0 %   MCV 70.8 (L) 80.0 - 100.0 fL   MCH 19.7 (L) 26.0 - 34.0 pg   MCHC 27.8 (L) 30.0 - 36.0 g/dL   RDW 62.3 (H) 76.2 - 83.1 %   Platelets 200 150 - 400 K/uL   nRBC 2.9 (H) 0.0 - 0.2 %    Assessment/Plan: Victoria Conner is a 28 y.o. [id number] s/p rLTCS for breech presentation at [redacted]w[redacted]d POD#1 complicated by: 1) Anemia -critical lab as above, plan for transfusion of 2upRBC -pt currently asymptomatic  2) Preeclampsia -currently on Mag, plan to discontinue this am -oral Lasix and procardia XL30mg  daily -UOP adequate  3) Postop care -plan to discontinue foley today -encourage ambulation -Lovenox for DVT prophylaxis  Contraception: decline Feeding: breast/bottle feeding  Dispo: Postop care as outlined above, plan for repeat CBC 6hr post transfusion   LOS: 1 day   Myna Hidalgo, DO Faculty Attending, [facility] for [facility] 07/10/2023, 5:39 AM

## 2023-07-10 NOTE — Clinical Social Work Maternal (Addendum)
CLINICAL SOCIAL WORK MATERNAL/CHILD NOTE  Patient Details  Name: Victoria Conner MRN: [id number] Date of Birth: 05/29/1996  Date:  Feb 19, 2024  Clinical Social Worker Initiating Note:  Albertine Patricia Date/Time: Initiated:  07/10/23/1608     Child's Name:  Victoria Conner   Biological Parents:  Mother, Father (FOB: Vista Deck, DOB: 06/24/1992)   Need for Interpreter:  Swahili   Reason for Referral:  Late or No Prenatal Care     Address: [address] [place], [place] [address]   Phone number:  [phone] (home)     Additional phone number:   Household Members/Support Persons (HM/SP):   Household Member/Support Person 1, Household Member/Support Person 2, Household Member/Support Person 4, Household Member/Support Person 5, Household Member/Support Person 6, Household Member/Support Person 3, Household Member/Support Person 7, Household Member/Support Person 8   HM/SP Name Relationship DOB or Age  HM/SP -1 Georgiann Cocker Oreti FOB/Spouse 06/24/1992  HM/SP -2 Jemjay Jumanne Son 08/19/2021  HM/SP -3   Mother-in-law    HM/SP -4   Father-in-law    HM/SP -5   FOB's sibling    HM/SP -6   Family member    HM/SP -7   Family member    HM/SP -8   Family member      Natural Supports (not living in the home):      Professional Supports: None   Employment: Homemaker   Type of Work:     Education:  9 to 11 years (10th grade)   Homebound arranged:    Surveyor, quantity Resources:  Medicaid   Other Resources:   (WIC referral placed)   Cultural/Religious Considerations Which May Impact Care:  MOB speaks Swahili  Strengths:  Home prepared for child     Psychotropic Medications:         Pediatrician:     Undecided, list provided  Pediatrician List:   [facility] [place]    [facility]      Pediatrician Fax Number:    Risk Factors/Current Problems:  Basic Needs  , Transportation     Cognitive State:  Able to Concentrate  ,  Alert  , Goal Oriented  , Linear Thinking     Mood/Affect:  Calm  , Constricted  , Relaxed     CSW Assessment: CSW was consulted due to limited prenatal care. CSW met with MOB at bedside to complete assessment. Geologist, engineering for Swahili translation was used during consult, [place], ID# [id number] and Jamison Neighbor, [place] [id number]. When CSW entered room, MOB was observed sitting in hospital bed. A visitor was present sitting nearby holding infant. CSW introduced self and requested to speak with MOB alone. MOB provided verbal consent to complete consult with visitor present. CSW explained reason for consult. MOB presented as calm, was agreeable to consult and remained engaged throughout encounter. Two additional visitors entered room towards the end of the consult. MOB provided verbal consent to continue with consult with all visitors present.  CSW reviewed address and phone number listed in chart. MOB reports that her address in chart is not current. MOB now resides at [address] [place], [place] [address] with FOB, her son, and 5 of FOB's family members. CSW inquired about mental health history. MOB denies a history of mental health diagnoses/symptoms and denies a history of postpartum depression. MOB identified FOB and her mother-in-law as supports. MOB denied current SI/HI. DV was not assessed due to visitor being  present.  CSW provided education regarding the baby blues period vs. perinatal mood disorders, discussed treatment and gave resources for mental health follow up if concerns arise. CSW recommends self-evaluation during the postpartum time period and encouraged MOB to contact a medical professional if symptoms are noted at any time.   MOB reports she has a crib and clothing for infant but is in need of more diapers, wipes, and a car seat. MOB reports that she has a car seat at home but it is expired. CSW inquired if a family member or friend can help her purchase a car seat. MOB states that she does  not have a way to get a new one. CSW explained that the hospital can provide a car seat for $30 cash if MOB has exhausted all other options as a one time option. MOB reports that she does not have a way to pay for the car seat. CSW explained that a car seat can be provided regardless of ability to pay. MOB reports that FOB plans to purchase diapers and wipes for infant. CSW provided MOB with [employer] for baby supplies in the future. CSW inquired if MOB receives [facility] or SNAP benefits. MOB reports she does not receive WIC benefits but was agreeable to a referral, which CSW placed with verbal consent. MOB states that she and FOB applied for food stamp benefits but were not approved. CSW provided MOB with Department of Social Services office address to reapply and also provided food bank resources. CSW inquired about a pediatrician for infant. MOB reports she has not decided on a pediatrician and that her care team is going to help her schedule an appointment for infant. CSW provided MOB with a pediatrician list.   CSW informed MOB about hospital drug screen policy due to limited prenatal care. CSW explained that infant's UDS and CDS are still pending and would be monitored and a CPS report would be made if warranted. MOB expressed understanding. CSW inquired about substance use/barriers to prenatal care during pregnancy. MOB denied using illicit substances during pregnancy. MOB identified transportation as a barrier to prenatal care. CSW inquired about transportation home and for infant's pediatrician appointments. MOB reports that FOB will provide transportation. CSW assessed for additional needs, MOB denied additional needs at this time.  CSW provided review of Sudden Infant Death Syndrome (SIDS) precautions.    CSW brought car seat to room.  CSW identifies no further need for intervention and no barriers to discharge at this time.  CSW Plan/Description:  No Further Intervention  Required/No Barriers to Discharge, Sudden Infant Death Syndrome (SIDS) Education, Perinatal Mood and Anxiety Disorder (PMADs) Education, Hospital Drug Screen Policy Information, Other Information/Referral to [facility], CSW Will Continue to Monitor Umbilical Cord Tissue Drug Screen Results and Make Report if Reggie Pile, LCSWA March 19, 2024, 4:19 PM

## 2023-07-11 LAB — BPAM RBC
Blood Product Expiration Date: 202502122359
Blood Product Expiration Date: 202502122359
ISSUE DATE / TIME: 202501190615
ISSUE DATE / TIME: 202501191155
Unit Type and Rh: 6200
Unit Type and Rh: 6200

## 2023-07-11 LAB — CBC
HCT: 28.3 % — ABNORMAL LOW (ref 36.0–46.0)
Hemoglobin: 8.3 g/dL — ABNORMAL LOW (ref 12.0–15.0)
MCH: 21.4 pg — ABNORMAL LOW (ref 26.0–34.0)
MCHC: 29.3 g/dL — ABNORMAL LOW (ref 30.0–36.0)
MCV: 72.9 fL — ABNORMAL LOW (ref 80.0–100.0)
Platelets: 168 10*3/uL (ref 150–400)
RBC: 3.88 MIL/uL (ref 3.87–5.11)
RDW: 26.3 % — ABNORMAL HIGH (ref 11.5–15.5)
WBC: 22.3 10*3/uL — ABNORMAL HIGH (ref 4.0–10.5)
nRBC: 3.6 % — ABNORMAL HIGH (ref 0.0–0.2)

## 2023-07-11 LAB — TYPE AND SCREEN
ABO/RH(D): AB POS
Antibody Screen: NEGATIVE
Unit division: 0
Unit division: 0

## 2023-07-11 MED ORDER — NIFEDIPINE ER OSMOTIC RELEASE 60 MG PO TB24
90.0000 mg | ORAL_TABLET | Freq: Every day | ORAL | Status: DC
  Administered 2023-07-11 – 2023-07-12 (×2): 90 mg via ORAL
  Filled 2023-07-11 (×2): qty 1

## 2023-07-11 NOTE — Progress Notes (Signed)
POSTPARTUM PROGRESS NOTE  POD #2  Subjective:  Victoria Conner is a 28 y.o. [id number] s/p rLTCS at [redacted]w[redacted]d. Today she notes she is doing ok. She denies any problems with ambulating or po intake. Denies nausea or vomiting. She has passed flatus, + BM.  Pain is well controlled.  Lochia minimal Denies fever/chills/chest pain/SOB.  no HA, no blurry vision, no RUQ pain  Due to elevated BP, foley was not removed until this am.  Pt has been ambulating, but has not yet voided on her own.  Objective: Blood pressure (!) 146/82, pulse 85, temperature 98.7 F (37.1 C), temperature source Oral, resp. rate 20, last menstrual period 11/30/2022, SpO2 96%, unknown if currently breastfeeding.  Physical Exam:  General: alert, cooperative and no distress Chest: no respiratory distress Heart: regular rate and rhythm Abdomen: obese, soft, nontender, +BS Uterine Fundus: firm, appropriately tender Incision: clean and dry DVT Evaluation: No calf swelling or tenderness Extremities: 1+ edema Skin: warm, dry  Results for orders placed or performed during the hospital encounter of 07/09/23 (from the past 24 hours)  CBC     Status: Abnormal   Collection Time: 07/10/23  6:23 PM  Result Value Ref Range   WBC 22.3 (H) 4.0 - 10.5 K/uL   RBC 3.88 3.87 - 5.11 MIL/uL   Hemoglobin 8.3 (L) 12.0 - 15.0 g/dL   HCT 65.7 (L) 84.6 - 96.2 %   MCV 72.9 (L) 80.0 - 100.0 fL   MCH 21.4 (L) 26.0 - 34.0 pg   MCHC 29.3 (L) 30.0 - 36.0 g/dL   RDW 95.2 (H) 84.1 - 32.4 %   Platelets 168 150 - 400 K/uL   nRBC 3.6 (H) 0.0 - 0.2 %    Assessment/Plan: Victoria Conner is a 28 y.o. [id number] s/p rLTCS at [redacted]w[redacted]d POD#2 complicated by: 1) Preeclampsia S/p Mag x 24hr -continue to titrate anti-hypertensives, increased to ProcardiaXL 90mg  daily -on Lasix  2) Postop care -voiding trial this am -pain controlled -Lovenox for prophylaxis  Contraception: declined Feeding: breast/bottle feeding  Dispo: care as outlined above, plan for  discharge home tomorrow   LOS: 2 days   Myna Hidalgo, DO Faculty Attending, [facility] for [facility] 07/11/2023, 7:14 AM

## 2023-07-12 ENCOUNTER — Other Ambulatory Visit (HOSPITAL_COMMUNITY): Payer: Self-pay

## 2023-07-12 ENCOUNTER — Encounter: Payer: Self-pay | Admitting: Obstetrics & Gynecology

## 2023-07-12 ENCOUNTER — Encounter: Payer: Self-pay | Admitting: Obstetrics and Gynecology

## 2023-07-12 DIAGNOSIS — Z98891 History of uterine scar from previous surgery: Secondary | ICD-10-CM

## 2023-07-12 LAB — SURGICAL PATHOLOGY

## 2023-07-12 MED ORDER — IBUPROFEN 600 MG PO TABS
600.0000 mg | ORAL_TABLET | Freq: Four times a day (QID) | ORAL | 0 refills | Status: AC
  Filled 2023-07-12: qty 30, 8d supply, fill #0

## 2023-07-12 MED ORDER — NIFEDIPINE ER 60 MG PO TB24
60.0000 mg | ORAL_TABLET | Freq: Two times a day (BID) | ORAL | 0 refills | Status: AC
  Filled 2023-07-12: qty 60, 30d supply, fill #0

## 2023-07-12 MED ORDER — ACETAMINOPHEN 500 MG PO TABS
1000.0000 mg | ORAL_TABLET | Freq: Four times a day (QID) | ORAL | 0 refills | Status: AC
  Filled 2023-07-12: qty 30, 4d supply, fill #0

## 2023-07-12 MED ORDER — FUROSEMIDE 20 MG PO TABS
20.0000 mg | ORAL_TABLET | Freq: Every day | ORAL | 0 refills | Status: AC
  Filled 2023-07-12: qty 5, 5d supply, fill #0

## 2023-07-12 MED ORDER — SENNOSIDES-DOCUSATE SODIUM 8.6-50 MG PO TABS
2.0000 | ORAL_TABLET | Freq: Every evening | ORAL | 0 refills | Status: AC | PRN
  Filled 2023-07-12: qty 60, 30d supply, fill #0

## 2023-07-12 MED ORDER — OXYCODONE HCL 5 MG PO TABS
5.0000 mg | ORAL_TABLET | ORAL | 0 refills | Status: AC | PRN
  Filled 2023-07-12: qty 12, 2d supply, fill #0

## 2023-07-12 NOTE — Progress Notes (Signed)
Discharge instructions provided to patient detailing postpartum care/infant care, PP Pre-e, c-section care, follow up care, medication, and when to call for help. Instructions provided on how to use home BP cuff and how to document in [employer]. Patient states she has someone at home that reads english and can help with [employer]. Interpreter Mohamed # [phone] translated discharge teaching entirely. Patient stated no further questions.

## 2023-07-12 NOTE — Plan of Care (Signed)
  Problem: Education: Goal: Knowledge of General Education information will improve Description: Including pain rating scale, medication(s)/side effects and non-pharmacologic comfort measures Outcome: Completed/Met   Problem: Health Behavior/Discharge Planning: Goal: Ability to manage health-related needs will improve Outcome: Completed/Met   Problem: Clinical Measurements: Goal: Ability to maintain clinical measurements within normal limits will improve Outcome: Completed/Met Goal: Will remain free from infection Outcome: Completed/Met Goal: Diagnostic test results will improve Outcome: Completed/Met Goal: Respiratory complications will improve Outcome: Completed/Met Goal: Cardiovascular complication will be avoided Outcome: Completed/Met   Problem: Activity: Goal: Risk for activity intolerance will decrease Outcome: Completed/Met   Problem: Nutrition: Goal: Adequate nutrition will be maintained Outcome: Completed/Met   Problem: Coping: Goal: Level of anxiety will decrease Outcome: Completed/Met   Problem: Elimination: Goal: Will not experience complications related to bowel motility Outcome: Completed/Met Goal: Will not experience complications related to urinary retention Outcome: Completed/Met   Problem: Pain Managment: Goal: General experience of comfort will improve and/or be controlled Outcome: Completed/Met   Problem: Safety: Goal: Ability to remain free from injury will improve Outcome: Completed/Met   Problem: Skin Integrity: Goal: Risk for impaired skin integrity will decrease Outcome: Completed/Met   Problem: Education: Goal: Knowledge of disease or condition will improve Outcome: Completed/Met Goal: Knowledge of the prescribed therapeutic regimen will improve Outcome: Completed/Met   Problem: Fluid Volume: Goal: Peripheral tissue perfusion will improve Outcome: Completed/Met   Problem: Clinical Measurements: Goal: Complications related to  disease process, condition or treatment will be avoided or minimized Outcome: Completed/Met   Problem: Education: Goal: Knowledge of the prescribed therapeutic regimen will improve Outcome: Completed/Met Goal: Understanding of sexual limitations or changes related to disease process or condition will improve Outcome: Completed/Met Goal: Individualized Educational Video(s) Outcome: Completed/Met   Problem: Self-Concept: Goal: Communication of feelings regarding changes in body function or appearance will improve Outcome: Completed/Met   Problem: Skin Integrity: Goal: Demonstration of wound healing without infection will improve Outcome: Completed/Met   Problem: Education: Goal: Knowledge of condition will improve Outcome: Completed/Met Goal: Individualized Educational Video(s) Outcome: Completed/Met Goal: Individualized Newborn Educational Video(s) Outcome: Completed/Met   Problem: Activity: Goal: Will verbalize the importance of balancing activity with adequate rest periods Outcome: Completed/Met Goal: Ability to tolerate increased activity will improve Outcome: Completed/Met   Problem: Coping: Goal: Ability to identify and utilize available resources and services will improve Outcome: Completed/Met   Problem: Life Cycle: Goal: Chance of risk for complications during the postpartum period will decrease Outcome: Completed/Met   Problem: Role Relationship: Goal: Ability to demonstrate positive interaction with newborn will improve Outcome: Completed/Met   Problem: Skin Integrity: Goal: Demonstration of wound healing without infection will improve Outcome: Completed/Met

## 2023-07-13 ENCOUNTER — Encounter: Payer: Medicaid Other | Admitting: Family Medicine

## 2023-07-18 MED ORDER — DIPHENHYDRAMINE HCL 25 MG PO CAPS: 25.0000 mg | ORAL_CAPSULE | Freq: Once | ORAL | Status: DC

## 2023-07-18 MED ORDER — ACETAMINOPHEN 325 MG PO TABS: 650.0000 mg | ORAL_TABLET | Freq: Once | ORAL | Status: DC

## 2023-07-19 ENCOUNTER — Ambulatory Visit: Payer: Medicaid Other

## 2023-07-20 ENCOUNTER — Telehealth (HOSPITAL_COMMUNITY): Payer: Self-pay | Admitting: *Deleted

## 2023-07-20 ENCOUNTER — Ambulatory Visit: Payer: Medicaid Other

## 2023-07-20 ENCOUNTER — Other Ambulatory Visit: Payer: Medicaid Other

## 2023-07-20 NOTE — Telephone Encounter (Signed)
07/20/2023  Name: Victoria Conner MRN: [id number] DOB: August 17, 1995  Reason for Call:  Transition of Care Hospital Discharge Call  Contact Status: Patient Contact Status: Complete  Language assistant needed: Interpreter Mode: Telephonic Interpreter Interpreter Name: Charmian Muff [phone]        Follow-Up Questions: Do You Have Any Concerns About Your Health As You Heal From Delivery?: No Do You Have Any Concerns About Your Infants Health?: No  Edinburgh Postnatal Depression Scale:  In the Past 7 Days: I have been able to laugh and see the funny side of things.: As much as I always could I have looked forward with enjoyment to things.: As much as I ever did I have blamed myself unnecessarily when things went wrong.: No, never I have been anxious or worried for no good reason.: No, not at all I have felt scared or panicky for no good reason.: No, not at all Things have been getting on top of me.: No, I have been coping as well as ever I have been so unhappy that I have had difficulty sleeping.: Not at all I have felt sad or miserable.: No, not at all I have been so unhappy that I have been crying.: No, never The thought of harming myself has occurred to me.: Never Edinburgh Postnatal Depression Scale Total: 0  PHQ2-9 Depression Scale:     Discharge Follow-up: Edinburgh score requires follow up?: No  Post-discharge interventions: NA  Salena Saner, RN 07/20/2023 15:54

## 2023-07-25 ENCOUNTER — Ambulatory Visit: Payer: Medicaid Other

## 2023-08-22 ENCOUNTER — Encounter: Payer: Self-pay | Admitting: Obstetrics and Gynecology

## 2023-08-22 ENCOUNTER — Ambulatory Visit: Payer: Medicaid Other | Admitting: Obstetrics and Gynecology

## 2023-08-23 NOTE — Progress Notes (Signed)
 Patient did not keep her postpartum appointment for 08/22/2023.  Cornelia Copa MD Attending [facility] for [employer] [facility])
